# Patient Record
Sex: Male | Born: 1950
Health system: Southern US, Community
[De-identification: ages and names within clinical notes are randomized; demographics above are authoritative.]

## PROBLEM LIST (undated history)

## (undated) DIAGNOSIS — G4733 Obstructive sleep apnea (adult) (pediatric): Secondary | ICD-10-CM

## (undated) DIAGNOSIS — I10 Essential (primary) hypertension: Secondary | ICD-10-CM

## (undated) DIAGNOSIS — Z973 Presence of spectacles and contact lenses: Secondary | ICD-10-CM

## (undated) DIAGNOSIS — N471 Phimosis: Secondary | ICD-10-CM

## (undated) DIAGNOSIS — I471 Supraventricular tachycardia: Secondary | ICD-10-CM

## (undated) DIAGNOSIS — M1990 Unspecified osteoarthritis, unspecified site: Secondary | ICD-10-CM

## (undated) DIAGNOSIS — F4310 Post-traumatic stress disorder, unspecified: Secondary | ICD-10-CM

## (undated) DIAGNOSIS — E785 Hyperlipidemia, unspecified: Secondary | ICD-10-CM

## (undated) DIAGNOSIS — Z9989 Dependence on other enabling machines and devices: Secondary | ICD-10-CM

## (undated) HISTORY — PX: KNEE ARTHROSCOPY: SUR90

## (undated) HISTORY — PX: TOTAL KNEE ARTHROPLASTY: SHX125

## (undated) HISTORY — PX: CARDIAC ELECTROPHYSIOLOGY MAPPING AND ABLATION: SHX1292

---

## 1998-07-16 ENCOUNTER — Ambulatory Visit (HOSPITAL_BASED_OUTPATIENT_CLINIC_OR_DEPARTMENT_OTHER): Admission: RE | Admit: 1998-07-16 | Discharge: 1998-07-16 | Payer: Self-pay | Admitting: Orthopedic Surgery

## 2003-05-05 ENCOUNTER — Encounter: Admission: RE | Admit: 2003-05-05 | Discharge: 2003-05-05 | Payer: Self-pay | Admitting: Cardiology

## 2003-05-18 ENCOUNTER — Ambulatory Visit (HOSPITAL_COMMUNITY): Admission: RE | Admit: 2003-05-18 | Discharge: 2003-05-18 | Payer: Self-pay | Admitting: Cardiology

## 2003-12-23 ENCOUNTER — Emergency Department (HOSPITAL_COMMUNITY): Admission: EM | Admit: 2003-12-23 | Discharge: 2003-12-23 | Payer: Self-pay | Admitting: Emergency Medicine

## 2005-06-08 ENCOUNTER — Ambulatory Visit (HOSPITAL_COMMUNITY): Admission: RE | Admit: 2005-06-08 | Discharge: 2005-06-08 | Payer: Self-pay | Admitting: Gastroenterology

## 2005-06-08 ENCOUNTER — Encounter (INDEPENDENT_AMBULATORY_CARE_PROVIDER_SITE_OTHER): Payer: Self-pay | Admitting: *Deleted

## 2007-06-28 ENCOUNTER — Encounter: Admission: RE | Admit: 2007-06-28 | Discharge: 2007-06-28 | Payer: Self-pay | Admitting: Cardiology

## 2008-01-10 DIAGNOSIS — I471 Supraventricular tachycardia, unspecified: Secondary | ICD-10-CM

## 2008-01-10 HISTORY — DX: Supraventricular tachycardia: I47.1

## 2008-01-10 HISTORY — DX: Supraventricular tachycardia, unspecified: I47.10

## 2008-10-29 ENCOUNTER — Observation Stay (HOSPITAL_COMMUNITY): Admission: AD | Admit: 2008-10-29 | Discharge: 2008-10-31 | Payer: Self-pay | Admitting: Cardiology

## 2008-10-29 ENCOUNTER — Ambulatory Visit: Payer: Self-pay | Admitting: Internal Medicine

## 2008-10-30 HISTORY — PX: CARDIOVASCULAR STRESS TEST: SHX262

## 2008-11-12 ENCOUNTER — Telehealth: Payer: Self-pay | Admitting: Internal Medicine

## 2008-11-12 ENCOUNTER — Ambulatory Visit: Payer: Self-pay

## 2008-11-12 ENCOUNTER — Encounter: Payer: Self-pay | Admitting: Internal Medicine

## 2008-11-12 DIAGNOSIS — I10 Essential (primary) hypertension: Secondary | ICD-10-CM

## 2008-11-12 DIAGNOSIS — I200 Unstable angina: Secondary | ICD-10-CM

## 2008-11-23 ENCOUNTER — Ambulatory Visit: Payer: Self-pay | Admitting: Cardiology

## 2008-11-23 ENCOUNTER — Ambulatory Visit (HOSPITAL_COMMUNITY): Admission: RE | Admit: 2008-11-23 | Discharge: 2008-11-23 | Payer: Self-pay | Admitting: Internal Medicine

## 2008-11-23 ENCOUNTER — Ambulatory Visit: Payer: Self-pay | Admitting: Internal Medicine

## 2008-11-23 ENCOUNTER — Encounter: Payer: Self-pay | Admitting: Internal Medicine

## 2008-11-23 ENCOUNTER — Ambulatory Visit: Payer: Self-pay

## 2008-11-23 HISTORY — PX: TRANSTHORACIC ECHOCARDIOGRAM: SHX275

## 2008-11-24 ENCOUNTER — Ambulatory Visit: Payer: Self-pay | Admitting: Internal Medicine

## 2008-11-24 ENCOUNTER — Telehealth: Payer: Self-pay | Admitting: Internal Medicine

## 2008-11-25 ENCOUNTER — Ambulatory Visit (HOSPITAL_COMMUNITY): Admission: AD | Admit: 2008-11-25 | Discharge: 2008-11-26 | Payer: Self-pay | Admitting: Internal Medicine

## 2008-11-25 ENCOUNTER — Ambulatory Visit: Payer: Self-pay | Admitting: Internal Medicine

## 2008-11-25 LAB — CONVERTED CEMR LAB
BUN: 19 mg/dL (ref 6–23)
Basophils Absolute: 0.1 10*3/uL (ref 0.0–0.1)
Basophils Relative: 0.7 % (ref 0.0–3.0)
CO2: 29 meq/L (ref 19–32)
Calcium: 9.2 mg/dL (ref 8.4–10.5)
Chloride: 106 meq/L (ref 96–112)
Creatinine, Ser: 1.3 mg/dL (ref 0.4–1.5)
Eosinophils Absolute: 0.2 10*3/uL (ref 0.0–0.7)
Eosinophils Relative: 2 % (ref 0.0–5.0)
GFR calc non Af Amer: 72.37 mL/min (ref >60)
Glucose, Bld: 89 mg/dL (ref 70–99)
HCT: 43.4 % (ref 39.0–52.0)
Hemoglobin: 14.7 g/dL (ref 13.0–17.0)
INR: 1.1 — ABNORMAL HIGH (ref 0.8–1.0)
Lymphocytes Relative: 30.9 % (ref 12.0–46.0)
Lymphs Abs: 2.6 10*3/uL (ref 0.7–4.0)
MCHC: 33.8 g/dL (ref 30.0–36.0)
MCV: 93 fL (ref 78.0–100.0)
Monocytes Absolute: 1.1 10*3/uL — ABNORMAL HIGH (ref 0.1–1.0)
Monocytes Relative: 13.5 % — ABNORMAL HIGH (ref 3.0–12.0)
Neutro Abs: 4.3 10*3/uL (ref 1.4–7.7)
Neutrophils Relative %: 52.9 % (ref 43.0–77.0)
Platelets: 151 10*3/uL (ref 150.0–400.0)
Potassium: 3.8 meq/L (ref 3.5–5.1)
Prothrombin Time: 11.6 s (ref 9.1–11.7)
RBC: 4.67 M/uL (ref 4.22–5.81)
RDW: 12.7 % (ref 11.5–14.6)
Sodium: 144 meq/L (ref 135–145)
WBC: 8.3 10*3/uL (ref 4.5–10.5)
aPTT: 28.3 s (ref 21.7–28.8)

## 2008-12-07 ENCOUNTER — Encounter: Payer: Self-pay | Admitting: Internal Medicine

## 2009-02-25 ENCOUNTER — Encounter: Payer: Self-pay | Admitting: Physician Assistant

## 2009-02-25 ENCOUNTER — Ambulatory Visit: Payer: Self-pay | Admitting: Cardiology

## 2009-02-25 DIAGNOSIS — R002 Palpitations: Secondary | ICD-10-CM

## 2009-02-25 LAB — CONVERTED CEMR LAB
BUN: 13 mg/dL (ref 6–23)
CO2: 32 meq/L (ref 19–32)
Calcium: 9.3 mg/dL (ref 8.4–10.5)
Chloride: 104 meq/L (ref 96–112)
Creatinine, Ser: 1.1 mg/dL (ref 0.4–1.5)
GFR calc non Af Amer: 87.68 mL/min (ref >60)
Glucose, Bld: 98 mg/dL (ref 70–99)
Potassium: 4.5 meq/L (ref 3.5–5.1)
Sodium: 142 meq/L (ref 135–145)
TSH: 1.77 microintl units/mL (ref 0.35–5.50)

## 2009-03-01 ENCOUNTER — Ambulatory Visit: Payer: Self-pay

## 2009-03-18 ENCOUNTER — Telehealth (INDEPENDENT_AMBULATORY_CARE_PROVIDER_SITE_OTHER): Payer: Self-pay | Admitting: *Deleted

## 2009-03-24 ENCOUNTER — Ambulatory Visit: Payer: Self-pay | Admitting: Internal Medicine

## 2009-04-06 ENCOUNTER — Telehealth (INDEPENDENT_AMBULATORY_CARE_PROVIDER_SITE_OTHER): Payer: Self-pay | Admitting: *Deleted

## 2009-04-09 ENCOUNTER — Telehealth: Payer: Self-pay | Admitting: Internal Medicine

## 2009-04-15 ENCOUNTER — Telehealth: Payer: Self-pay | Admitting: Internal Medicine

## 2010-02-08 NOTE — Assessment & Plan Note (Signed)
Summary: ROV/ GD   Visit Type:  Follow-up  CC:  pt states feeling irregular heart rate.  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 He is done well since then until now.  The patient complains of several week history of skipping of his heart which lasted 15-20 seconds causes a sluggish feeling resolves. This is off-and-on all day long. Some days he doesn't have it but other days. It is more frequent. He says is different than the pounding. He had when he had his ablation. He denies any dizziness, chest pain, dyspnea, dyspnea on exertion, presyncope, or syncope. He drinks one cup of coffee a day, and one soda a day. Occasionally, he'll have some tea. He is very anxious about this and has many questions that I answered.  Problems Prior to Update: 1)  Palpitations  (ICD-785.1) 2)  Paroxysmal Supraventricular Tachycardia  (ICD-427.0) 3)  Svt/ Psvt/ Pat  (ICD-427.0) 4)  Sinus Bradycardia  (ICD-427.81) 5)  Hypertension, Benign  (ICD-401.1) 6)  Intermediate Coronary Syndrome  (ICD-411.1)  Current Medications (verified): 1)  Simvastatin 40 Mg Tabs (Simvastatin) .... Take One Tablet Daily 2)  Aspirin Ec 81 Mg Tbec (Aspirin) .... Take One Tablet Daily 3)  Diovan Hct 160-25 Mg Tabs (Valsartan-Hydrochlorothiazide) .... Take One Daily  Allergies: No Known Drug Allergies  Past History:  Past Medical History: Last updated: 11/12/2008 tinnitus hypertension bradycardia svt dyslipidemia  Family History: Reviewed history from 11/12/2008 and no changes required. Family History of Coronary Artery Disease:   Social History: Reviewed history from 11/12/2008 and no changes required. Married  Tobacco Use - No.  Alcohol Use - no  Review of Systems       shistory of present illness patient has an exercise bike, but is reluctant to use it  because he is afraid of what would happen if he got his heart rate up  Vital Signs:  Patient profile:   60 year old male Height:      76 inches Weight:      239 pounds Pulse rate:   76 / minute Pulse rhythm:   regular BP sitting:   112 / 80  (right arm)  Vitals Entered By: Jacquelin Hawking, CMA (February 25, 2009 10:00 AM)  Physical Exam  General:   Well-nournished, in no acute distress. Neck: No JVD, HJR, Bruit, or thyroid enlargement Lungs: No tachypnea, clear without wheezing, rales, or rhonchi Cardiovascular: RRR, PMI not displaced, heart sounds normal, no murmurs, gallops, bruit, thrill, or heave. Abdomen: BS normal. Soft without organomegaly, masses, lesions or tenderness. Extremities: without cyanosis, clubbing or edema. Good distal pulses bilateral SKin: Warm, no lesions or rashes  Musculoskeletal: No deformities Neuro: no focal signs    EKG  Procedure date:  02/25/2009  Findings:      normal sinus rhythm, normal EKG  Impression & Recommendations:  Problem # 1:  PALPITATIONS (ICD-785.1) Patient has several week history of new onset palpitations, which are different from when he had his ablation. I asked him to decrease his caffeine intake, we will check a BMET & TSH. F/U Dr. Graciela Husbands The following medications were removed from the medication list:    Verapamil Hcl 120 Mg Tabs (Verapamil hcl) .Marland Kitchen... Take two tablet every am His updated medication list for this problem includes:    Aspirin Ec 81 Mg Tbec (Aspirin) .Marland Kitchen... Take one tablet daily  Problem # 2:  PAROXYSMAL SUPRAVENTRICULAR  TACHYCARDIA (ICD-427.0) Patient had radiofrequency catheter ablation of the AV nodal reentrant tachycardia in November 2010 The following medications were removed from the medication list:    Verapamil Hcl 120 Mg Tabs (Verapamil hcl) .Marland Kitchen... Take two tablet every am His updated medication list for this problem includes:    Aspirin Ec 81 Mg Tbec (Aspirin) .Marland Kitchen... Take one tablet  daily  Orders: EKG w/ Interpretation (93000) TLB-BMP (Basic Metabolic Panel-BMET) (80048-METABOL) TLB-TSH (Thyroid Stimulating Hormone) (84443-TSH) Event (Event)  Problem # 3:  HYPERTENSION, BENIGN (ICD-401.1) blood pressure controlled The following medications were removed from the medication list:    Verapamil Hcl 120 Mg Tabs (Verapamil hcl) .Marland Kitchen... Take two tablet every am    Hydrochlorothiazide 25 Mg Tabs (Hydrochlorothiazide) .Marland Kitchen... Take 1/2 tablet daily His updated medication list for this problem includes:    Aspirin Ec 81 Mg Tbec (Aspirin) .Marland Kitchen... Take one tablet daily    Diovan Hct 160-25 Mg Tabs (Valsartan-hydrochlorothiazide) .Marland Kitchen... Take one daily  Orders: TLB-BMP (Basic Metabolic Panel-BMET) (80048-METABOL) TLB-TSH (Thyroid Stimulating Hormone) (84443-TSH)  Problem # 4:  SINUS BRADYCARDIA (ICD-427.81) patient has history of bradycardia, but is in sinus rhythm today The following medications were removed from the medication list:    Verapamil Hcl 120 Mg Tabs (Verapamil hcl) .Marland Kitchen... Take two tablet every am His updated medication list for this problem includes:    Aspirin Ec 81 Mg Tbec (Aspirin) .Marland Kitchen... Take one tablet daily  Orders: Event (Event)  Problem # 5:  HYPERTENSION, BENIGN (ICD-401.1) blood pressure control The following medications were removed from the medication list:    Verapamil Hcl 120 Mg Tabs (Verapamil hcl) .Marland Kitchen... Take two tablet every am    Hydrochlorothiazide 25 Mg Tabs (Hydrochlorothiazide) .Marland Kitchen... Take 1/2 tablet daily His updated medication list for this problem includes:    Aspirin Ec 81 Mg Tbec (Aspirin) .Marland Kitchen... Take one tablet daily    Diovan Hct 160-25 Mg Tabs (Valsartan-hydrochlorothiazide) .Marland Kitchen... Take one daily  Orders: TLB-BMP (Basic Metabolic Panel-BMET) (80048-METABOL) TLB-TSH (Thyroid Stimulating Hormone) (03500-XFG)  Patient Instructions: 1)  Your physician recommends that you schedule a follow-up appointment in: 2 to 3 weeks with Dr. Graciela Husbands 2)   Your physician has recommended that you wear an event monitor.  Event monitors are medical devices that record the heart's electrical activity. Doctors most often use these monitors to diagnose arrhythmias. Arrhythmias are problems with the speed or rhythm of the heartbeat. The monitor is a small, portable device. You can wear one while you do your normal daily activities. This is usually used to diagnose what is causing palpitations/syncope (passing out). 3)  Your physician recommends that you return for lab work in: today BMET,TSH.

## 2010-02-08 NOTE — Progress Notes (Signed)
  Walk in Patient Form Recieved " Pateint left [facility] forms to be completed" forwarded to [facility]  April 06, 2009 12:12 PM

## 2010-02-08 NOTE — Progress Notes (Signed)
Summary: CALLING REGARDING PAPER WORK  Phone Note Call from Patient Call back at [phone]   Caller: Patient Action Taken: Appt Scheduled Summary of Call: PT CALLING ABOUT PAPER WORK Initial call taken by: Judie Grieve,  April 15, 2009 3:57 PM  Follow-up for Phone Call        Pt has dropped off paperwork from the [place] that Dr. Graciela Husbands needs to fill out concerning the pt's RF ablation in Nov. 2010.  He needs this ASAP so that he can get it to the [place] - they've already sent him a couple of notices about this paperwork. Will forward this to [facility] and Dr. Graciela Husbands. Follow-up by: Minerva Areola, RN, BSN,  April 15, 2009 5:10 PM  Additional Follow-up for Phone Call Additional follow up Details #1::        this was done  about a week ago I dont know where it is Additional Follow-up by: Nathen May, MD, [facility],  April 15, 2009 6:14 PM     Appended Document: CALLING REGARDING PAPER WORK Paperwork at front desk-- copied last office notes and hospital discharge summary for pt to take to [place].  Cell phone number unavailable.   Appended Document: CALLING REGARDING PAPER WORK SPoke with pt this morning concerning paperwork,Told him to leave a Message for [employer] Nurse...Marland KitchenMarland Kitchen

## 2010-02-08 NOTE — Assessment & Plan Note (Signed)
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 He is done well since then until now.  The patient complains of several week history of skipping of his heart which lasted 15-20 seconds causes a sluggish feeling resolves. This is off-and-on all day long. He saw Herma Carson will arrange for him to have a very clear. This demonstrated PVCs that were symptomatic PACs that were symptomatic and a wide complex 8 beat run that was detected automatically.  Otherwise he feels terrific;  he has been using some caffeine he has been trying to cut back on this.  Current Medications (verified): 1)  Simvastatin 40 Mg Tabs (Simvastatin) .... Take One Tablet Daily 2)  Aspirin Ec 81 Mg Tbec (Aspirin) .... Take One Tablet Daily 3)  Diovan Hct 160-25 Mg Tabs (Valsartan-Hydrochlorothiazide) .... Take One Daily  Allergies (verified): No Known Drug Allergies  Past History:  Past Medical History: Last updated: 11/12/2008 tinnitus hypertension bradycardia svt dyslipidemia  Past Surgical History: Last updated: 11/12/2008 neg  Family History: Last updated: 11/12/2008 Family History of Coronary Artery Disease:   Social History: Last updated: 11/12/2008 Married  Tobacco Use - No.  Alcohol Use - no  Vital Signs:  Patient profile:   60 year old male Height:      76 inches Weight:      233 pounds BMI:     28.46 Pulse rate:   92 / minute Pulse rhythm:   regular BP sitting:   125 / 83  (left arm) Cuff size:   large  Vitals Entered By: Judithe Modest CMA (March 24, 2009 11:36 AM)  Physical Exam  General:  The patient was alert and oriented in no acute distress. HEENT Normal.  Neck veins were flat, carotids were brisk.  Lungs were clear.  Heart sounds were regular without murmurs or gallops.  Abdomen was soft with  active bowel sounds. There is no clubbing cyanosis or edema. Skin Warm and dry    Event Monitor  Procedure date:  03/24/2009  Findings:      recorder demonstrated symptomatic PACs and PVCs and a symptomatic wide-complex from about 8 beats probably SVT  Impression & Recommendations:  Problem # 1:  PALPITATIONS (ICD-785.1) history Dr. correlate with PVCs and PACs. Reassurance was given His updated medication list for this problem includes:    Aspirin Ec 81 Mg Tbec (Aspirin) .Marland Kitchen... Take one tablet daily  Problem # 2:  SVT/ PSVT/ PAT (ICD-427.0) no recurrent SVT His updated medication list for this problem includes:    Aspirin Ec 81 Mg Tbec (Aspirin) .Marland Kitchen... Take one tablet daily  Problem # 3:  HYPERTENSION, BENIGN (ICD-401.1) blood pressure is well controlled His updated medication list for this problem includes:    Aspirin Ec 81 Mg Tbec (Aspirin) .Marland Kitchen... Take one tablet daily    Diovan Hct 160-25 Mg Tabs (Valsartan-hydrochlorothiazide) .Marland Kitchen... Take one daily  Other Orders: EKG w/ Interpretation (93000)  Patient Instructions: 1)  Your physician recommends that you schedule a follow-up appointment in: 6 MONTHS

## 2010-02-08 NOTE — Progress Notes (Signed)
Summary: pt needs papers dropped off from [place]  Phone Note Call from Patient Call back at [phone]   Caller: Patient Reason for Call: Talk to Nurse, Talk to Doctor Summary of Call: pt need paper work that was dropped off from [place] office Initial call taken by: Omer Jack,  April 09, 2009 4:25 PM  Follow-up for Phone Call        spoke with amber, she has the form and dr Graciela Husbands will sign on monday or tuesday and we will let pt know when completed. pt aware Robert Goody, RN  April 09, 2009 4:37 PM

## 2010-02-08 NOTE — Progress Notes (Signed)
Summary: Monitor Results  Phone Note Outgoing Call   Call placed by: Duncan Dull, RN, BSN,  March 18, 2009 4:07 PM Call placed to: Patient Summary of Call: Received monitor report that showed NSVT. I called the pt and he was at work at the time of the event. I ask him how he was feeling today and he told he that he had a feeling today around 2pm (the event I received was at 1200) that he cannot describe. He said it was just the worst feeling he has ever felt. It only last for a few seconds. Afterwards he felt weak and dizzy for a little while. He did not think to hit the button to send a trasmission (said that was the last thing on his mind). He was "working hard" during the time of the episode a bit harder than usual. I moved his appt from 3/28 with Dr. Graciela Husbands up to 3/16 (the next time he is in the office). I s/w Dr. Johney Frame who confirmed NSVT and thinks its ok for him to wait until Wed. I advised the pt if he gets "that feeling" anymore between now and then that lasts more than a few seconds to call 911. He understands.  Initial call taken by: Duncan Dull, RN, BSN,  March 18, 2009 4:24 PM

## 2010-04-10 ENCOUNTER — Emergency Department (HOSPITAL_BASED_OUTPATIENT_CLINIC_OR_DEPARTMENT_OTHER)
Admission: EM | Admit: 2010-04-10 | Discharge: 2010-04-10 | Disposition: A | Discharge status: Home / Self Care | Payer: BC Managed Care – PPO | Attending: Emergency Medicine | Admitting: Emergency Medicine

## 2010-04-10 ENCOUNTER — Emergency Department (INDEPENDENT_AMBULATORY_CARE_PROVIDER_SITE_OTHER): Payer: BC Managed Care – PPO

## 2010-04-10 DIAGNOSIS — Y93E1 Activity, personal bathing and showering: Secondary | ICD-10-CM | POA: Insufficient documentation

## 2010-04-10 DIAGNOSIS — M533 Sacrococcygeal disorders, not elsewhere classified: Secondary | ICD-10-CM | POA: Insufficient documentation

## 2010-04-10 DIAGNOSIS — I10 Essential (primary) hypertension: Secondary | ICD-10-CM | POA: Insufficient documentation

## 2010-04-10 DIAGNOSIS — S63509A Unspecified sprain of unspecified wrist, initial encounter: Secondary | ICD-10-CM | POA: Insufficient documentation

## 2010-04-10 DIAGNOSIS — Z79899 Other long term (current) drug therapy: Secondary | ICD-10-CM | POA: Insufficient documentation

## 2010-04-10 DIAGNOSIS — E785 Hyperlipidemia, unspecified: Secondary | ICD-10-CM | POA: Insufficient documentation

## 2010-04-10 DIAGNOSIS — W010XXA Fall on same level from slipping, tripping and stumbling without subsequent striking against object, initial encounter: Secondary | ICD-10-CM | POA: Insufficient documentation

## 2010-04-10 DIAGNOSIS — Y92009 Unspecified place in unspecified non-institutional (private) residence as the place of occurrence of the external cause: Secondary | ICD-10-CM | POA: Insufficient documentation

## 2010-04-11 DIAGNOSIS — Y93E1 Activity, personal bathing and showering: Secondary | ICD-10-CM

## 2010-04-11 DIAGNOSIS — W010XXA Fall on same level from slipping, tripping and stumbling without subsequent striking against object, initial encounter: Secondary | ICD-10-CM

## 2010-04-11 DIAGNOSIS — M25559 Pain in unspecified hip: Secondary | ICD-10-CM

## 2010-04-11 DIAGNOSIS — M25539 Pain in unspecified wrist: Secondary | ICD-10-CM

## 2010-04-14 LAB — BASIC METABOLIC PANEL
BUN: 16 mg/dL (ref 6–23)
CO2: 29 mEq/L (ref 19–32)
Calcium: 8.7 mg/dL (ref 8.4–10.5)
Chloride: 102 mEq/L (ref 96–112)
Creatinine, Ser: 1.37 mg/dL (ref 0.4–1.5)
GFR calc Af Amer: >60 mL/min (ref >60)
GFR calc non Af Amer: 53 mL/min — ABNORMAL LOW (ref >60)
Glucose, Bld: 120 mg/dL — ABNORMAL HIGH (ref 70–99)
Potassium: 4.5 mEq/L (ref 3.5–5.1)
Sodium: 136 mEq/L (ref 135–145)

## 2010-04-14 LAB — TROPONIN I
Troponin I: 0.02 ng/mL (ref 0.00–0.06)
Troponin I: 0.03 ng/mL (ref 0.00–0.06)
Troponin I: 0.05 ng/mL (ref 0.00–0.06)

## 2010-04-14 LAB — CK TOTAL AND CKMB (NOT AT ARMC)
CK, MB: 4.5 ng/mL — ABNORMAL HIGH (ref 0.3–4.0)
CK, MB: 4.6 ng/mL — ABNORMAL HIGH (ref 0.3–4.0)
CK, MB: 8.1 ng/mL — ABNORMAL HIGH (ref 0.3–4.0)
Relative Index: 1.4 (ref 0.0–2.5)
Relative Index: 1.5 (ref 0.0–2.5)
Relative Index: 1.7 (ref 0.0–2.5)
Total CK: 298 U/L — ABNORMAL HIGH (ref 7–232)
Total CK: 327 U/L — ABNORMAL HIGH (ref 7–232)
Total CK: 468 U/L — ABNORMAL HIGH (ref 7–232)

## 2010-04-14 LAB — LIPID PANEL
Cholesterol: 164 mg/dL (ref 0–200)
HDL: 32 mg/dL — ABNORMAL LOW (ref >39)
LDL Cholesterol: 77 mg/dL (ref 0–99)
Total CHOL/HDL Ratio: 5.1 RATIO
Triglycerides: 277 mg/dL — ABNORMAL HIGH (ref <150)
VLDL: 55 mg/dL — ABNORMAL HIGH (ref 0–40)

## 2010-04-14 LAB — CBC
HCT: 40.8 % (ref 39.0–52.0)
Hemoglobin: 14.1 g/dL (ref 13.0–17.0)
MCHC: 34.5 g/dL (ref 30.0–36.0)
MCV: 91.2 fL (ref 78.0–100.0)
Platelets: 129 10*3/uL — ABNORMAL LOW (ref 150–400)
RBC: 4.48 MIL/uL (ref 4.22–5.81)
RDW: 13.9 % (ref 11.5–15.5)
WBC: 6.7 10*3/uL (ref 4.0–10.5)

## 2010-04-14 LAB — BRAIN NATRIURETIC PEPTIDE: Pro B Natriuretic peptide (BNP): 307 pg/mL — ABNORMAL HIGH (ref 0.0–100.0)

## 2010-04-14 LAB — TSH: TSH: 2.623 u[IU]/mL (ref 0.350–4.500)

## 2010-04-14 LAB — PROTIME-INR
INR: 1.05 (ref 0.00–1.49)
Prothrombin Time: 13.6 seconds (ref 11.6–15.2)

## 2010-05-27 NOTE — Op Note (Signed)
NAMEENDRE, Robert NO.:  [id number]   MEDICAL RECORD NO.:  [id number]          PATIENT TYPE:  AMB   LOCATION:  ENDO                         FACILITY:  [facility]   PHYSICIAN:  Bernette Redbird, M.D.   DATE OF BIRTH:  [birth year]   DATE OF PROCEDURE:  06/08/2005  DATE OF DISCHARGE:                                 OPERATIVE REPORT   PROCEDURE:  Colonoscopy with biopsy.   INDICATIONS:  Initial colon cancer screening exam in a 60 year old gentleman  with a possible family history of colon cancer (two uncles), details  sketchy.  No worrisome symptoms.   FINDINGS:  Diminutive sessile polyp in the ascending colon.   PROCEDURE:  The procedure had been discussed with the patient through our  open access program, but I reviewed the purpose and risks of the procedure  with the patient immediately prior to the exam.  Written consent was  provided.  Digital exam of the prostate was unremarkable.  Sedation was  fentanyl 100 mcg and Versed 10 mg IV without arrhythmias or desaturation.   The Olympus adult video colonoscope was advanced to the terminal ileum  without difficulty.  The terminal ileum had some lymphoid hyperplasia but  was otherwise normal.  The quality of the prep was excellent and it is felt  that all areas were well-seen.   There was a 2-mm sessile polyp of the proximal ascending colon removed by  single cold biopsy.  No other polyps were seen, and there was no evidence of  cancer, colitis, vascular malformations or diverticular disease.  Retroflexion in the rectum was unremarkable.   The patient tolerated the procedure well, and there were no apparent  complications.   IMPRESSION:  1.  Solitary diminutive sessile polyp removed as described above (211.3).  2.  Screening for colon cancer in a patient with possible family history of      colon cancer (V76.51).   PLAN:  Await pathology.           ______________________________  Bernette Redbird, M.D.     RB/MEDQ  D:  06/08/2005  T:  06/08/2005  Job:  [id number]   cc:   Osvaldo Shipper. Spruill, M.D.  Fax: [phone]

## 2012-05-14 ENCOUNTER — Encounter (HOSPITAL_BASED_OUTPATIENT_CLINIC_OR_DEPARTMENT_OTHER): Payer: Self-pay

## 2012-05-14 ENCOUNTER — Emergency Department (HOSPITAL_BASED_OUTPATIENT_CLINIC_OR_DEPARTMENT_OTHER)
Admission: EM | Admit: 2012-05-14 | Discharge: 2012-05-14 | Disposition: A | Discharge status: Home / Self Care | Payer: 59 | Attending: Emergency Medicine | Admitting: Emergency Medicine

## 2012-05-14 DIAGNOSIS — S058X9A Other injuries of unspecified eye and orbit, initial encounter: Secondary | ICD-10-CM | POA: Insufficient documentation

## 2012-05-14 DIAGNOSIS — S0501XA Injury of conjunctiva and corneal abrasion without foreign body, right eye, initial encounter: Secondary | ICD-10-CM

## 2012-05-14 DIAGNOSIS — Y929 Unspecified place or not applicable: Secondary | ICD-10-CM | POA: Insufficient documentation

## 2012-05-14 DIAGNOSIS — H10219 Acute toxic conjunctivitis, unspecified eye: Secondary | ICD-10-CM | POA: Insufficient documentation

## 2012-05-14 DIAGNOSIS — I10 Essential (primary) hypertension: Secondary | ICD-10-CM | POA: Insufficient documentation

## 2012-05-14 DIAGNOSIS — Z7982 Long term (current) use of aspirin: Secondary | ICD-10-CM | POA: Insufficient documentation

## 2012-05-14 DIAGNOSIS — Y939 Activity, unspecified: Secondary | ICD-10-CM | POA: Insufficient documentation

## 2012-05-14 DIAGNOSIS — E785 Hyperlipidemia, unspecified: Secondary | ICD-10-CM | POA: Insufficient documentation

## 2012-05-14 DIAGNOSIS — Z79899 Other long term (current) drug therapy: Secondary | ICD-10-CM | POA: Insufficient documentation

## 2012-05-14 DIAGNOSIS — H10211 Acute toxic conjunctivitis, right eye: Secondary | ICD-10-CM

## 2012-05-14 DIAGNOSIS — T6091XA Toxic effect of unspecified pesticide, accidental (unintentional), initial encounter: Secondary | ICD-10-CM | POA: Insufficient documentation

## 2012-05-14 HISTORY — DX: Essential (primary) hypertension: I10

## 2012-05-14 HISTORY — DX: Hyperlipidemia, unspecified: E78.5

## 2012-05-14 MED ORDER — FLUORESCEIN SODIUM 1 MG OP STRP
ORAL_STRIP | OPHTHALMIC | Status: AC
  Administered 2012-05-14: 1 via OPHTHALMIC
  Filled 2012-05-14: qty 1

## 2012-05-14 MED ORDER — TETRACAINE HCL 0.5 % OP SOLN
1.0000 [drp] | Freq: Once | OPHTHALMIC | Status: AC
  Administered 2012-05-14: 2 [drp] via OPHTHALMIC

## 2012-05-14 MED ORDER — TETRACAINE HCL 0.5 % OP SOLN
OPHTHALMIC | Status: AC
  Administered 2012-05-14: 2 [drp] via OPHTHALMIC
  Filled 2012-05-14: qty 2

## 2012-05-14 MED ORDER — FLUORESCEIN SODIUM 1 MG OP STRP
1.0000 | ORAL_STRIP | Freq: Once | OPHTHALMIC | Status: AC
  Administered 2012-05-14: 1 via OPHTHALMIC

## 2012-05-14 NOTE — ED Notes (Signed)
Pt states that he got an insecticide in his eye, pt states that he washed his eye out with water briefly, then used soap to wash his eye out.  Pt with redness and watering to OD.

## 2012-05-14 NOTE — ED Notes (Signed)
Contacted [facility] center who recommended that pt's eye be irrigated with normal saline, states that pt should not have any permanent damage from this exposure, at worst a corneal abrasion may present, if any vision loss today is noted, it should return by the morning, if not follow up with opthalmology.

## 2012-05-14 NOTE — ED Provider Notes (Signed)
History     CSN: [id number]  Arrival date & time 05/14/12  1653   First MD Initiated Contact with Patient 05/14/12 1747      Chief Complaint  Patient presents with  . Eye Problem    (Consider location/radiation/quality/duration/timing/severity/associated sxs/prior treatment) HPI 62 year old male accidentally splashed a drop of insecticide in his right eye and was instructed to come to the emergency department according to directions on the bottle of insecticide. He has some foreign body sensation in mild to moderate pain but no change in vision no pus drainage no other concerns in this accident occurred just prior to arrival in his tetanus shot is up-to-date. Past Medical History  Diagnosis Date  . Hypertension   . Hyperlipidemia     Past Surgical History  Procedure Laterality Date  . Total knee arthroplasty Bilateral   . Cardiac electrophysiology mapping and ablation      History reviewed. No pertinent family history.  History  Substance Use Topics  . Smoking status: Never Smoker   . Smokeless tobacco: Never Used  . Alcohol Use: No      Review of Systems See HPI. Allergies  Review of patient's allergies indicates no known allergies.  Home Medications   Current Outpatient Rx  Name  Route  Sig  Dispense  Refill  . aspirin 81 MG tablet   Oral   Take 81 mg by mouth daily.         Marland Kitchen SIMVASTATIN PO   Oral   Take by mouth.         Marland Kitchen UNKNOWN TO PATIENT      Blood pressure medication taken daily           BP 130/86  Pulse 98  Temp(Src) 98.9 F (37.2 C) (Oral)  Resp 20  Ht 6\' 3"  (1.905 m)  Wt 235 lb (106.595 kg)  BMI 29.37 kg/m2  SpO2 95%  Physical Exam  Nursing note and vitals reviewed. Constitutional:  Awake, alert, nontoxic appearance.  HENT:  Head: Atraumatic.  Eyes: EOM are normal. Pupils are equal, round, and reactive to light. Right eye exhibits no discharge. Left eye exhibits no discharge.  Mild right conjunctival injection, visual  acuity baseline for patient, peripheral visual fields full to confrontation, completely relieve the foreign body sensation with tetracaine, swelling examination reveals no corneal foreign body noted anterior chamber is quiet, positive fluorescein uptake consistent with diffuse right corneal abrasion, pH between 7-8 after irrigation  Neck: Neck supple.  Cardiovascular: Normal rate and regular rhythm.   No murmur heard. Pulmonary/Chest: Effort normal and breath sounds normal. No respiratory distress. He has no wheezes. He has no rales. He exhibits no tenderness.  Abdominal: Soft. There is no tenderness. There is no rebound.  Musculoskeletal: He exhibits no tenderness.  Baseline ROM, no obvious new focal weakness.  Neurological:  Mental status and motor strength appears baseline for patient and situation.  Skin: No rash noted.  Psychiatric: He has a normal mood and affect.    ED Course  Procedures (including critical care time)  Labs Reviewed - No data to display No results found.   1. Right corneal abrasion, initial encounter   2. Chemical conjunctivitis, right       MDM   Patient / Family / Caregiver informed of clinical course, understand medical decision-making process, and agree with plan.  I doubt any other EMC precluding discharge at this time including, but not necessarily limited to the following:globe perforation.       Jonny Ruiz  Alberteen Sam, MD 05/15/12 2156

## 2012-05-14 NOTE — ED Notes (Addendum)
MD at bedside. Verbal discharge instructions given.  Pt voiced understanding.

## 2015-12-22 DIAGNOSIS — E785 Hyperlipidemia, unspecified: Secondary | ICD-10-CM | POA: Diagnosis not present

## 2015-12-22 DIAGNOSIS — Z8249 Family history of ischemic heart disease and other diseases of the circulatory system: Secondary | ICD-10-CM | POA: Diagnosis not present

## 2015-12-22 DIAGNOSIS — F4310 Post-traumatic stress disorder, unspecified: Secondary | ICD-10-CM | POA: Diagnosis not present

## 2015-12-22 DIAGNOSIS — I10 Essential (primary) hypertension: Secondary | ICD-10-CM | POA: Diagnosis not present

## 2016-05-05 DIAGNOSIS — I10 Essential (primary) hypertension: Secondary | ICD-10-CM | POA: Diagnosis not present

## 2016-05-05 DIAGNOSIS — I471 Supraventricular tachycardia: Secondary | ICD-10-CM | POA: Diagnosis not present

## 2016-05-05 DIAGNOSIS — E785 Hyperlipidemia, unspecified: Secondary | ICD-10-CM | POA: Diagnosis not present

## 2016-05-05 DIAGNOSIS — Z8249 Family history of ischemic heart disease and other diseases of the circulatory system: Secondary | ICD-10-CM | POA: Diagnosis not present

## 2016-05-08 DIAGNOSIS — R52 Pain, unspecified: Secondary | ICD-10-CM | POA: Diagnosis not present

## 2016-05-08 DIAGNOSIS — S2341XA Sprain of ribs, initial encounter: Secondary | ICD-10-CM | POA: Diagnosis not present

## 2016-05-17 DIAGNOSIS — E785 Hyperlipidemia, unspecified: Secondary | ICD-10-CM | POA: Diagnosis not present

## 2016-05-17 DIAGNOSIS — I10 Essential (primary) hypertension: Secondary | ICD-10-CM | POA: Diagnosis not present

## 2016-10-08 ENCOUNTER — Emergency Department (HOSPITAL_BASED_OUTPATIENT_CLINIC_OR_DEPARTMENT_OTHER)
Admission: EM | Admit: 2016-10-08 | Discharge: 2016-10-08 | Disposition: A | Discharge status: Home / Self Care | Payer: PPO | Attending: Emergency Medicine | Admitting: Emergency Medicine

## 2016-10-08 ENCOUNTER — Emergency Department (HOSPITAL_BASED_OUTPATIENT_CLINIC_OR_DEPARTMENT_OTHER): Payer: PPO

## 2016-10-08 ENCOUNTER — Encounter (HOSPITAL_BASED_OUTPATIENT_CLINIC_OR_DEPARTMENT_OTHER): Payer: Self-pay | Admitting: Emergency Medicine

## 2016-10-08 DIAGNOSIS — W109XXA Fall (on) (from) unspecified stairs and steps, initial encounter: Secondary | ICD-10-CM | POA: Insufficient documentation

## 2016-10-08 DIAGNOSIS — Y9389 Activity, other specified: Secondary | ICD-10-CM | POA: Diagnosis not present

## 2016-10-08 DIAGNOSIS — Z79899 Other long term (current) drug therapy: Secondary | ICD-10-CM | POA: Diagnosis not present

## 2016-10-08 DIAGNOSIS — I10 Essential (primary) hypertension: Secondary | ICD-10-CM | POA: Diagnosis not present

## 2016-10-08 DIAGNOSIS — Z96653 Presence of artificial knee joint, bilateral: Secondary | ICD-10-CM | POA: Diagnosis not present

## 2016-10-08 DIAGNOSIS — Z7982 Long term (current) use of aspirin: Secondary | ICD-10-CM | POA: Insufficient documentation

## 2016-10-08 DIAGNOSIS — S79911A Unspecified injury of right hip, initial encounter: Secondary | ICD-10-CM | POA: Diagnosis not present

## 2016-10-08 DIAGNOSIS — M79604 Pain in right leg: Secondary | ICD-10-CM | POA: Diagnosis not present

## 2016-10-08 DIAGNOSIS — M5430 Sciatica, unspecified side: Secondary | ICD-10-CM | POA: Insufficient documentation

## 2016-10-08 DIAGNOSIS — M25561 Pain in right knee: Secondary | ICD-10-CM | POA: Diagnosis not present

## 2016-10-08 DIAGNOSIS — Y999 Unspecified external cause status: Secondary | ICD-10-CM | POA: Insufficient documentation

## 2016-10-08 DIAGNOSIS — Y929 Unspecified place or not applicable: Secondary | ICD-10-CM | POA: Diagnosis not present

## 2016-10-08 DIAGNOSIS — M25551 Pain in right hip: Secondary | ICD-10-CM | POA: Diagnosis not present

## 2016-10-08 DIAGNOSIS — W19XXXA Unspecified fall, initial encounter: Secondary | ICD-10-CM

## 2016-10-08 DIAGNOSIS — M5431 Sciatica, right side: Secondary | ICD-10-CM | POA: Diagnosis not present

## 2016-10-08 HISTORY — DX: Post-traumatic stress disorder, unspecified: F43.10

## 2016-10-08 MED ORDER — CYCLOBENZAPRINE HCL 10 MG PO TABS: 10.0000 mg | ORAL_TABLET | Freq: Two times a day (BID) | ORAL | 0 refills | Status: DC | PRN

## 2016-10-08 MED ORDER — TRAMADOL HCL 50 MG PO TABS: 50.0000 mg | ORAL_TABLET | Freq: Four times a day (QID) | ORAL | 0 refills | Status: DC | PRN

## 2016-10-08 MED ORDER — METHYLPREDNISOLONE 4 MG PO TBPK: ORAL_TABLET | ORAL | 0 refills | Status: DC

## 2016-10-08 MED ORDER — MELOXICAM 15 MG PO TABS: 15.0000 mg | ORAL_TABLET | Freq: Every day | ORAL | 0 refills | Status: DC

## 2016-10-08 NOTE — Discharge Instructions (Signed)
SEEK IMMEDIATE MEDICAL ATTENTION IF: New numbness, tingling, weakness, or problem with the use of your arms or legs.  Severe back pain not relieved with medications.  Change in bowel or bladder control.  Increasing pain in any areas of the body (such as chest or abdominal pain).  Shortness of breath, dizziness or fainting.  Nausea (feeling sick to your stomach), vomiting, fever, or sweats.  

## 2016-10-08 NOTE — ED Provider Notes (Signed)
[facility] Provider Note   CSN: [id number] Arrival date & time: 10/08/16  1002     History   Chief Complaint Chief Complaint  Patient presents with  . Knee Pain    HPI Robert Arias is a 66 y.o. male who presents emergency Department with chief complaint of right hip pain. Patient states that he lost his balance on the stairs 2 days ago and fell onto his right hip and bottom and slid down several stairs. Since that time he has had pain radiating down the back of the leg and to the lateral side of his calf. He has a history of bilateral total knee replacements and a history of sciatica. He is followed previously with [place] [facility]. Since his pain is worse when he sits on the right, turns to the right, bears weight on the right leg. Denies weakness, loss of bowel/bladder function or saddle anesthesia. Denies neck stiffness, headache, rash.  Denies fever or recent procedures to back.   HPI  Past Medical History:  Diagnosis Date  . Hyperlipidemia   . Hypertension   . PTSD (post-traumatic stress disorder)     Patient Active Problem List   Diagnosis Date Noted  . PALPITATIONS 02/25/2009  . HYPERTENSION, BENIGN 11/12/2008  . INTERMEDIATE CORONARY SYNDROME 11/12/2008    Past Surgical History:  Procedure Laterality Date  . CARDIAC ELECTROPHYSIOLOGY MAPPING AND ABLATION    . TOTAL KNEE ARTHROPLASTY Bilateral        Home Medications    Prior to Admission medications   Medication Sig Start Date End Date Taking? Authorizing Provider  hydrochlorothiazide (HYDRODIURIL) 25 MG tablet Take 25 mg by mouth daily.   Yes [provider]  losartan (COZAAR) 50 MG tablet Take 50 mg by mouth daily.   Yes [provider]  PARoxetine (PAXIL) 10 MG tablet Take 10 mg by mouth daily.   Yes [provider]  QUEtiapine (SEROQUEL) 100 MG tablet Take 100 mg by mouth at bedtime.   Yes [provider]  aspirin 81 MG tablet Take 81 mg by mouth  daily.    [provider]  SIMVASTATIN PO Take by mouth.    [provider]  UNKNOWN TO PATIENT Blood pressure medication taken daily    [provider]    Family History History reviewed. No pertinent family history.  Social History Social History  Substance Use Topics  . Smoking status: Never Smoker  . Smokeless tobacco: Never Used  . Alcohol use No     Allergies   Patient has no known allergies.   Review of Systems Review of Systems  Ten systems reviewed and are negative for acute change, except as noted in the HPI.   Physical Exam Updated Vital Signs BP (!) 153/90 (BP Location: Left Arm)   Pulse 60   Temp 98.2 F (36.8 C) (Oral)   Resp 20   Ht  (1.93 m)   Wt 111.1 kg (245 lb)   SpO2 100%   BMI 29.82 kg/m   Physical Exam  Constitutional: He is oriented to person, place, and time. He appears well-developed and well-nourished. No distress.  HENT:  Head: Normocephalic and atraumatic.  Eyes: Conjunctivae are normal. No scleral icterus.  Neck: Normal range of motion. Neck supple.  Cardiovascular: Normal rate, regular rhythm and normal heart sounds.   Pulmonary/Chest: Effort normal and breath sounds normal. No respiratory distress.  Abdominal: Soft. There is no tenderness.  Musculoskeletal: He exhibits no edema.  Normal knee and  hip exam. No bruising or tenderness noted over the sacrum hip or gluteal region. Positive straight leg test on the right. Normal DTRs and sensation, bilateral DP and PT pulses present. Antalgic gait  Neurological: He is alert and oriented to person, place, and time.  Skin: Skin is warm and dry. He is not diaphoretic.  Psychiatric: His behavior is normal.  Nursing note and vitals reviewed.    ED Treatments / Results  Labs (all labs ordered are listed, but only abnormal results are displayed) Labs Reviewed - No data to display  EKG  EKG Interpretation None       Radiology Dg Knee Complete 4 Views  Right  Result Date: 10/08/2016 CLINICAL DATA:  Acute right knee pain following fall 1 week ago. History of right knee arthroplasty 5-6 years ago. EXAM: RIGHT KNEE - COMPLETE 4+ VIEW COMPARISON:  None. FINDINGS: Right total knee arthroplasty identified. There is no evidence of acute fracture or dislocation. No complicating hardware features noted. IMPRESSION: No acute abnormality. Right total knee arthroplasty without complicating features. Electronically Signed   By: Harmon Pier M.D.   On: 10/08/2016 11:37   Dg Hip Unilat With Pelvis 2-3 Views Right  Result Date: 10/08/2016 CLINICAL DATA:  Acute right hip pain following fall 1 week ago. Initial encounter. EXAM: DG HIP (WITH OR WITHOUT PELVIS) 2-3V RIGHT COMPARISON:  04/11/2010 radiographs FINDINGS: There is no evidence of acute fracture or dislocation. Mild degenerative changes in both hips again identified. No suspicious focal bony lesions are present. Moderate degenerative changes in the lower lumbar spine are again noted. IMPRESSION: No acute abnormality. Mild degenerative changes in the hips. Moderate degenerative changes in the lower lumbar spine. Electronically Signed   By: Harmon Pier M.D.   On: 10/08/2016 11:39    Procedures Procedures (including critical care time)  Medications Ordered in ED Medications - No data to display   Initial Impression / Assessment and Plan / ED Course  I have reviewed the triage vital signs and the nursing notes.  Pertinent labs & imaging results that were available during my care of the patient were reviewed by me and considered in my medical decision making (see chart for details).     Patient with back pain.  No neurological deficits and normal neuro exam.  Patient can walk but states is painful.  No loss of bowel or bladder control.  No concern for cauda equina.  No fever, night sweats, weight loss, h/o cancer, IVDU.  RICE protocol and pain medicine indicated and discussed with patient.    Final  Clinical Impressions(s) / ED Diagnoses   Final diagnoses:  Fall    New Prescriptions New Prescriptions   No medications on file     Arthor Captain, PA-C 10/08/16 1218    Arby Barrette, MD 10/13/16 [phone]

## 2016-10-08 NOTE — ED Triage Notes (Signed)
Patient states that he fell about 1 week ago and hurt his right knee. Hx of knee replacement in the past. Reports that he is now having some hip pain

## 2017-01-20 DIAGNOSIS — I471 Supraventricular tachycardia: Secondary | ICD-10-CM | POA: Diagnosis not present

## 2017-01-20 DIAGNOSIS — E785 Hyperlipidemia, unspecified: Secondary | ICD-10-CM | POA: Diagnosis not present

## 2017-01-20 DIAGNOSIS — I10 Essential (primary) hypertension: Secondary | ICD-10-CM | POA: Diagnosis not present

## 2017-01-22 DIAGNOSIS — E785 Hyperlipidemia, unspecified: Secondary | ICD-10-CM | POA: Diagnosis not present

## 2017-01-22 DIAGNOSIS — I471 Supraventricular tachycardia: Secondary | ICD-10-CM | POA: Diagnosis not present

## 2017-01-22 DIAGNOSIS — F4310 Post-traumatic stress disorder, unspecified: Secondary | ICD-10-CM | POA: Diagnosis not present

## 2017-01-22 DIAGNOSIS — Z8249 Family history of ischemic heart disease and other diseases of the circulatory system: Secondary | ICD-10-CM | POA: Diagnosis not present

## 2017-01-22 DIAGNOSIS — I10 Essential (primary) hypertension: Secondary | ICD-10-CM | POA: Diagnosis not present

## 2017-01-22 DIAGNOSIS — R0789 Other chest pain: Secondary | ICD-10-CM | POA: Diagnosis not present

## 2017-05-21 DIAGNOSIS — N401 Enlarged prostate with lower urinary tract symptoms: Secondary | ICD-10-CM | POA: Diagnosis not present

## 2017-05-21 DIAGNOSIS — N5201 Erectile dysfunction due to arterial insufficiency: Secondary | ICD-10-CM | POA: Diagnosis not present

## 2017-05-21 DIAGNOSIS — R3911 Hesitancy of micturition: Secondary | ICD-10-CM | POA: Diagnosis not present

## 2017-07-10 DIAGNOSIS — N471 Phimosis: Secondary | ICD-10-CM | POA: Diagnosis not present

## 2017-07-10 DIAGNOSIS — N5201 Erectile dysfunction due to arterial insufficiency: Secondary | ICD-10-CM | POA: Diagnosis not present

## 2017-07-10 DIAGNOSIS — R3911 Hesitancy of micturition: Secondary | ICD-10-CM | POA: Diagnosis not present

## 2017-07-10 DIAGNOSIS — N401 Enlarged prostate with lower urinary tract symptoms: Secondary | ICD-10-CM | POA: Diagnosis not present

## 2017-07-20 ENCOUNTER — Other Ambulatory Visit: Payer: Self-pay | Admitting: Urology

## 2017-08-07 ENCOUNTER — Encounter (HOSPITAL_BASED_OUTPATIENT_CLINIC_OR_DEPARTMENT_OTHER): Payer: Self-pay | Admitting: *Deleted

## 2017-08-07 NOTE — Progress Notes (Signed)
Spoke w/ pt via phone for pre-op interview.  Npo after mn w/ exception clear liquids until 0700 (no cream /milk products).  Arrive at 1100.  Need istat 8 and ekg.  Asked pt to bring cpap.

## 2017-08-10 ENCOUNTER — Ambulatory Visit (HOSPITAL_BASED_OUTPATIENT_CLINIC_OR_DEPARTMENT_OTHER)
Admission: RE | Admit: 2017-08-10 | Discharge: 2017-08-10 | Disposition: A | Discharge status: Home / Self Care | Payer: PPO | Source: Ambulatory Visit | Attending: Urology | Admitting: Urology

## 2017-08-10 ENCOUNTER — Encounter (HOSPITAL_BASED_OUTPATIENT_CLINIC_OR_DEPARTMENT_OTHER)
Admission: RE | Disposition: A | Discharge status: Home / Self Care | Payer: Self-pay | Source: Ambulatory Visit | Attending: Urology

## 2017-08-10 ENCOUNTER — Ambulatory Visit (HOSPITAL_BASED_OUTPATIENT_CLINIC_OR_DEPARTMENT_OTHER): Payer: PPO | Admitting: Anesthesiology

## 2017-08-10 ENCOUNTER — Encounter (HOSPITAL_BASED_OUTPATIENT_CLINIC_OR_DEPARTMENT_OTHER): Payer: Self-pay

## 2017-08-10 ENCOUNTER — Other Ambulatory Visit: Payer: Self-pay

## 2017-08-10 DIAGNOSIS — I471 Supraventricular tachycardia: Secondary | ICD-10-CM | POA: Insufficient documentation

## 2017-08-10 DIAGNOSIS — F4310 Post-traumatic stress disorder, unspecified: Secondary | ICD-10-CM | POA: Diagnosis not present

## 2017-08-10 DIAGNOSIS — E785 Hyperlipidemia, unspecified: Secondary | ICD-10-CM | POA: Insufficient documentation

## 2017-08-10 DIAGNOSIS — Z9889 Other specified postprocedural states: Secondary | ICD-10-CM | POA: Insufficient documentation

## 2017-08-10 DIAGNOSIS — Z79899 Other long term (current) drug therapy: Secondary | ICD-10-CM | POA: Diagnosis not present

## 2017-08-10 DIAGNOSIS — Z96653 Presence of artificial knee joint, bilateral: Secondary | ICD-10-CM | POA: Diagnosis not present

## 2017-08-10 DIAGNOSIS — G4733 Obstructive sleep apnea (adult) (pediatric): Secondary | ICD-10-CM | POA: Diagnosis not present

## 2017-08-10 DIAGNOSIS — F419 Anxiety disorder, unspecified: Secondary | ICD-10-CM | POA: Diagnosis not present

## 2017-08-10 DIAGNOSIS — Z87891 Personal history of nicotine dependence: Secondary | ICD-10-CM | POA: Insufficient documentation

## 2017-08-10 DIAGNOSIS — I10 Essential (primary) hypertension: Secondary | ICD-10-CM | POA: Insufficient documentation

## 2017-08-10 DIAGNOSIS — N471 Phimosis: Secondary | ICD-10-CM | POA: Diagnosis not present

## 2017-08-10 DIAGNOSIS — Z7982 Long term (current) use of aspirin: Secondary | ICD-10-CM | POA: Insufficient documentation

## 2017-08-10 HISTORY — DX: Phimosis: N47.1

## 2017-08-10 HISTORY — DX: Presence of spectacles and contact lenses: Z97.3

## 2017-08-10 HISTORY — DX: Obstructive sleep apnea (adult) (pediatric): G47.33

## 2017-08-10 HISTORY — DX: Dependence on other enabling machines and devices: Z99.89

## 2017-08-10 HISTORY — DX: Supraventricular tachycardia: I47.1

## 2017-08-10 HISTORY — PX: CIRCUMCISION: SHX1350

## 2017-08-10 HISTORY — DX: Unspecified osteoarthritis, unspecified site: M19.90

## 2017-08-10 LAB — POCT I-STAT, CHEM 8
BUN: 15 mg/dL (ref 8–23)
CHLORIDE: 105 mmol/L (ref 98–111)
Calcium, Ion: 1.24 mmol/L (ref 1.15–1.40)
Creatinine, Ser: 1.2 mg/dL (ref 0.61–1.24)
Glucose, Bld: 97 mg/dL (ref 70–99)
HCT: 46 % (ref 39.0–52.0)
HEMOGLOBIN: 15.6 g/dL (ref 13.0–17.0)
Potassium: 3.9 mmol/L (ref 3.5–5.1)
Sodium: 145 mmol/L (ref 135–145)
TCO2: 28 mmol/L (ref 22–32)

## 2017-08-10 SURGERY — CIRCUMCISION, ADULT
Anesthesia: General | Site: Penis

## 2017-08-10 MED ORDER — BUPIVACAINE HCL 0.25 % IJ SOLN
INTRAMUSCULAR | Status: DC | PRN
  Administered 2017-08-10: 10 mL

## 2017-08-10 MED ORDER — DEXAMETHASONE SODIUM PHOSPHATE 10 MG/ML IJ SOLN
INTRAMUSCULAR | Status: AC
  Filled 2017-08-10: qty 1

## 2017-08-10 MED ORDER — LACTATED RINGERS IV SOLN
INTRAVENOUS | Status: DC
  Administered 2017-08-10 (×3): via INTRAVENOUS
  Filled 2017-08-10: qty 1000

## 2017-08-10 MED ORDER — ONDANSETRON HCL 4 MG/2ML IJ SOLN
INTRAMUSCULAR | Status: AC
  Filled 2017-08-10: qty 2

## 2017-08-10 MED ORDER — MIDAZOLAM HCL 5 MG/5ML IJ SOLN
INTRAMUSCULAR | Status: DC | PRN
  Administered 2017-08-10: 2 mg via INTRAVENOUS

## 2017-08-10 MED ORDER — FENTANYL CITRATE (PF) 100 MCG/2ML IJ SOLN
INTRAMUSCULAR | Status: AC
  Filled 2017-08-10: qty 2

## 2017-08-10 MED ORDER — OXYCODONE HCL 5 MG/5ML PO SOLN
5.0000 mg | Freq: Once | ORAL | Status: DC | PRN
Start: 2017-08-10 — End: 2017-08-10
  Filled 2017-08-10: qty 5

## 2017-08-10 MED ORDER — CLINDAMYCIN PHOSPHATE 900 MG/50ML IV SOLN
INTRAVENOUS | Status: AC
  Filled 2017-08-10: qty 50

## 2017-08-10 MED ORDER — PROPOFOL 10 MG/ML IV BOLUS
INTRAVENOUS | Status: DC | PRN
  Administered 2017-08-10: 250 mg via INTRAVENOUS
  Administered 2017-08-10: 50 mg via INTRAVENOUS

## 2017-08-10 MED ORDER — LIDOCAINE HCL (PF) 1 % IJ SOLN
INTRAMUSCULAR | Status: DC | PRN
  Administered 2017-08-10: 10 mL

## 2017-08-10 MED ORDER — PROMETHAZINE HCL 25 MG/ML IJ SOLN
6.2500 mg | INTRAMUSCULAR | Status: DC | PRN
  Filled 2017-08-10: qty 1

## 2017-08-10 MED ORDER — SENNOSIDES-DOCUSATE SODIUM 8.6-50 MG PO TABS
1.0000 | ORAL_TABLET | Freq: Two times a day (BID) | ORAL | 0 refills | Status: AC
Start: 2017-08-10 — End: ?

## 2017-08-10 MED ORDER — PROPOFOL 10 MG/ML IV BOLUS
INTRAVENOUS | Status: AC
  Filled 2017-08-10: qty 20

## 2017-08-10 MED ORDER — EPHEDRINE SULFATE-NACL 50-0.9 MG/10ML-% IV SOSY
PREFILLED_SYRINGE | INTRAVENOUS | Status: DC | PRN
  Administered 2017-08-10: 5 mg via INTRAVENOUS

## 2017-08-10 MED ORDER — LACTATED RINGERS IV SOLN
INTRAVENOUS | Status: DC
  Filled 2017-08-10: qty 1000

## 2017-08-10 MED ORDER — LIDOCAINE 2% (20 MG/ML) 5 ML SYRINGE
INTRAMUSCULAR | Status: AC
  Filled 2017-08-10: qty 5

## 2017-08-10 MED ORDER — OXYCODONE HCL 5 MG PO TABS
5.0000 mg | ORAL_TABLET | Freq: Once | ORAL | Status: DC | PRN
  Filled 2017-08-10: qty 1

## 2017-08-10 MED ORDER — PHENYLEPHRINE 40 MCG/ML (10ML) SYRINGE FOR IV PUSH (FOR BLOOD PRESSURE SUPPORT)
PREFILLED_SYRINGE | INTRAVENOUS | Status: DC | PRN
  Administered 2017-08-10: 40 ug via INTRAVENOUS
  Administered 2017-08-10: 80 ug via INTRAVENOUS
  Administered 2017-08-10: 40 ug via INTRAVENOUS

## 2017-08-10 MED ORDER — DEXAMETHASONE SODIUM PHOSPHATE 10 MG/ML IJ SOLN
INTRAMUSCULAR | Status: DC | PRN
  Administered 2017-08-10: 10 mg via INTRAVENOUS

## 2017-08-10 MED ORDER — TRAMADOL HCL 50 MG PO TABS: 50.0000 mg | ORAL_TABLET | Freq: Four times a day (QID) | ORAL | 0 refills | Status: AC | PRN

## 2017-08-10 MED ORDER — MEPERIDINE HCL 25 MG/ML IJ SOLN
6.2500 mg | INTRAMUSCULAR | Status: DC | PRN
  Filled 2017-08-10: qty 1

## 2017-08-10 MED ORDER — FENTANYL CITRATE (PF) 100 MCG/2ML IJ SOLN
INTRAMUSCULAR | Status: DC | PRN
  Administered 2017-08-10: 25 ug via INTRAVENOUS

## 2017-08-10 MED ORDER — HYDROMORPHONE HCL 1 MG/ML IJ SOLN
0.2500 mg | INTRAMUSCULAR | Status: DC | PRN
  Filled 2017-08-10: qty 0.5

## 2017-08-10 MED ORDER — MIDAZOLAM HCL 2 MG/2ML IJ SOLN
INTRAMUSCULAR | Status: AC
  Filled 2017-08-10: qty 2

## 2017-08-10 MED ORDER — CLINDAMYCIN PHOSPHATE 900 MG/50ML IV SOLN
900.0000 mg | INTRAVENOUS | Status: AC
  Administered 2017-08-10 (×2): 900 mg via INTRAVENOUS
  Filled 2017-08-10: qty 50

## 2017-08-10 MED ORDER — ONDANSETRON HCL 4 MG/2ML IJ SOLN
INTRAMUSCULAR | Status: DC | PRN
  Administered 2017-08-10: 4 mg via INTRAVENOUS

## 2017-08-10 SURGICAL SUPPLY — 27 items
BANDAGE COBAN STERILE 2 (GAUZE/BANDAGES/DRESSINGS) ×3 IMPLANT
BLADE SURG 15 STRL LF DISP TIS (BLADE) ×1 IMPLANT
BLADE SURG 15 STRL SS (BLADE) ×3
BNDG CONFORM 2 STRL LF (GAUZE/BANDAGES/DRESSINGS) ×3 IMPLANT
COVER BACK TABLE 60X90IN (DRAPES) ×3 IMPLANT
COVER MAYO STAND STRL (DRAPES) ×3 IMPLANT
DRAPE LAPAROTOMY 100X72 PEDS (DRAPES) ×3 IMPLANT
ELECT NDL TIP 2.8 STRL (NEEDLE) IMPLANT
ELECT NEEDLE TIP 2.8 STRL (NEEDLE) IMPLANT
ELECT REM PT RETURN 9FT ADLT (ELECTROSURGICAL) ×3
ELECTRODE REM PT RTRN 9FT ADLT (ELECTROSURGICAL) ×1 IMPLANT
GAUZE XEROFORM 1X8 LF (GAUZE/BANDAGES/DRESSINGS) ×3 IMPLANT
GLOVE BIO SURGEON STRL SZ7.5 (GLOVE) ×3 IMPLANT
GOWN STRL REUS W/TWL LRG LVL3 (GOWN DISPOSABLE) ×3 IMPLANT
KIT TURNOVER CYSTO (KITS) ×3 IMPLANT
NDL HYPO 25X1 1.5 SAFETY (NEEDLE) ×1 IMPLANT
NEEDLE HYPO 25X1 1.5 SAFETY (NEEDLE) ×3 IMPLANT
NS IRRIG 500ML POUR BTL (IV SOLUTION) ×2 IMPLANT
PACK BASIN DAY SURGERY FS (CUSTOM PROCEDURE TRAY) ×3 IMPLANT
PENCIL BUTTON HOLSTER BLD 10FT (ELECTRODE) ×3 IMPLANT
SUT VIC AB 3-0 SH 27 (SUTURE) ×3
SUT VIC AB 3-0 SH 27X BRD (SUTURE) IMPLANT
SUT VIC AB 3-0 SH 27XBRD (SUTURE) ×1 IMPLANT
SYR CONTROL 10ML LL (SYRINGE) ×3 IMPLANT
TOWEL OR 17X24 6PK STRL BLUE (TOWEL DISPOSABLE) ×3 IMPLANT
TRAY DSU PREP LF (CUSTOM PROCEDURE TRAY) ×3 IMPLANT
WATER STERILE IRR 500ML POUR (IV SOLUTION) IMPLANT

## 2017-08-10 NOTE — Anesthesia Postprocedure Evaluation (Signed)
Anesthesia Post Note  Patient: Robert Arias  Procedure(s) Performed: CIRCUMCISION ADULT (N/A Penis)     Patient location during evaluation: PACU Anesthesia Type: General Level of consciousness: awake and alert and oriented Pain management: pain level controlled Vital Signs Assessment: post-procedure vital signs reviewed and stable Respiratory status: spontaneous breathing, nonlabored ventilation and respiratory function stable Cardiovascular status: blood pressure returned to baseline and stable Postop Assessment: no apparent nausea or vomiting Anesthetic complications: no    Last Vitals:  Vitals:   08/10/17 1415 08/10/17 1425  BP: 139/79   Pulse: (!) 59 61  Resp: 11 15  Temp:    SpO2: 100% 100%    Last Pain:  Vitals:   08/10/17 1445  TempSrc:   PainSc: 0-No pain                 Sedalia Greeson A.

## 2017-08-10 NOTE — Discharge Instructions (Signed)
1 - Stitches are dissolvable. It is OK to shower anytime. Dressing can come off morning of day after surgery.  No sexual stimulation x 2 weeks.   2 - Call MD or go to ER for fever >102, severe pain / nausea / vomiting not relieved by medications, or acute change in medical status  Circumcision-Home Care Instructions  The following instructions have been prepared to help you care for yourself upon your return home today.   Wound Care & Hygiene:   You may apply ice to the penis.  This may help to decrease swelling.  Remove the dressing tomorrow.  If the dressing falls off before then, leave it off.  You may shower or bathe in 48 hours  Gently wash the penis with soap and water.  The stitches do not need to be removed.  Activity:  Do not drive or operate any equipment today.  The effects of anesthesia are still present, drowsiness may result.  Rest today, not necessarily flat bed rest, just take it easy.  You may resume your normal activity in one to two days or as indicated by your physician.  Sexual Activity:  Erection and sexual relations should be avoided for 2 weeks.  Return to Work:  One to two days or as indicated by your physician   Diet:  Drink liquids or eat a very light diet this evening.  You may resume a regular diet tomorrow.  General Expectations of your surgery:   You may have a small amount of bleeding  The penis will be swollen and bruised for approximately one week  You may wake during the night with an erection, usually this is caused by having a full bladder so you should try to urinate (pass your water) to relieve the erection or apply ice to the penis  Unexpected Observations - Call your doctor if these occur!  Persistent or heavy bleeding  Temperature of 101 degrees or more  Severe pain not relieved by medication          Post Anesthesia Home Care Instructions  Activity: Get plenty of rest for the remainder of the day. A responsible individual  must stay with you for 24 hours following the procedure.  For the next 24 hours, DO NOT: -Drive a car -Advertising copywriterperate machinery -Drink alcoholic beverages -Take any medication unless instructed by your physician -Make any legal decisions or sign important papers.  Meals: Start with liquid foods such as gelatin or soup. Progress to regular foods as tolerated. Avoid greasy, spicy, heavy foods. If nausea and/or vomiting occur, drink only clear liquids until the nausea and/or vomiting subsides. Call your physician if vomiting continues.  Special Instructions/Symptoms: Your throat may feel dry or sore from the anesthesia or the breathing tube placed in your throat during surgery. If this causes discomfort, gargle with warm salt water. The discomfort should disappear within 24 hours.  If you had a scopolamine patch placed behind your ear for the management of post- operative nausea and/or vomiting:  1. The medication in the patch is effective for 72 hours, after which it should be removed.  Wrap patch in a tissue and discard in the trash. Wash hands thoroughly with soap and water. 2. You may remove the patch earlier than 72 hours if you experience unpleasant side effects which may include dry mouth, dizziness or visual disturbances. 3. Avoid touching the patch. Wash your hands with soap and water after contact with the patch.

## 2017-08-10 NOTE — Anesthesia Preprocedure Evaluation (Signed)
Anesthesia Evaluation  Patient identified by MRN, date of birth, ID band Patient awake    Reviewed: Allergy & Precautions, NPO status , Patient's Chart, lab work & pertinent test results  Airway Mallampati: II  TM Distance: >3 FB Neck ROM: Full    Dental  (+) Teeth Intact, Dental Advisory Given   Pulmonary sleep apnea and Continuous Positive Airway Pressure Ventilation , former smoker,    breath sounds clear to auscultation       Cardiovascular hypertension, Pt. on medications  Rhythm:Regular Rate:Normal     Neuro/Psych PSYCHIATRIC DISORDERS Anxiety    GI/Hepatic negative GI ROS, Neg liver ROS,   Endo/Other  negative endocrine ROS  Renal/GU negative Renal ROS     Musculoskeletal  (+) Arthritis ,   Abdominal Normal abdominal exam  (+)   Peds  Hematology negative hematology ROS (+)   Anesthesia Other Findings - PTSD  Reproductive/Obstetrics                             Anesthesia Physical Anesthesia Plan  ASA: II  Anesthesia Plan: General   Post-op Pain Management:    Induction: Intravenous  PONV Risk Score and Plan: 3 and Ondansetron, Treatment may vary due to age or medical condition and Midazolam  Airway Management Planned: LMA  Additional Equipment: None  Intra-op Plan:   Post-operative Plan: Extubation in OR  Informed Consent: I have reviewed the patients History and Physical, chart, labs and discussed the procedure including the risks, benefits and alternatives for the proposed anesthesia with the patient or authorized representative who has indicated his/her understanding and acceptance.   Dental advisory given  Plan Discussed with: CRNA  Anesthesia Plan Comments:         Anesthesia Quick Evaluation

## 2017-08-10 NOTE — Op Note (Signed)
NAMArletta Bale: Sager, [name] MEDICAL RECORD [id number] ACCOUNT [id number] DATE OF BIRTH:13-Sep-1950 FACILITY: [facility] LOCATION: [facility] PHYSICIAN:Christopherjohn Schiele Berneice HeinrichMANNY, MD  OPERATIVE REPORT  DATE OF PROCEDURE:  08/10/2017  PREOPERATIVE DIAGNOSIS:  Phimosis.  POSTOPERATIVE DIAGNOSIS:  Resolution of phimosis.  PROCEDURE:   1.  Circumcision. 2.  Penile block.  SURGEON:  Sebastian Acheheodore Jalani Cullifer, MD  ESTIMATED BLOOD LOSS:  Nil.  MEDICATIONS:  None.  SPECIMENS:  Foreskin for discard.  FINDINGS:  Mild phimosis.  INDICATIONS:  The patient is a pleasant 67 year old gentleman with progressive bothersome phimosis; is interfering with hygiene and inability to avoid.  He has elected circumcision as definitive management.  Informed consent was obtained and placed in  medical record.  PROCEDURE PERFORMED:  The patient identified as being Robert Arias, procedure of circumcision was confirmed.  Procedure timeout was performed.  Intravenous antibiotics administered.  General LMA anesthesia induced.  The patient was placed into a  supine position.  A sterile field was created by prepping and draping the patient's penis, proximal thighs, lower abdomen and the scrotum using iodine, taking great care to also prep the inner foreskin leaflet, which was retractile.  The outer foreskin  leaflet was marked to the level of the unstretched corona of the glans using a marking pen and a proximal collar was formed approximately 7 mm proximal to the corona of the glans and the previous collar also circumscribed.  These were connected in the  midline and the redundant preputial tissue was removed from the underlying tissue using cautery dissection.  Point hemostasis was achieved using coagulation current cautery.  The 12 o'clock position was reapproximated with a single Vicryl stitch and a  U-stitch was placed at the area of the frenulum and 2 separate suture lines of running Vicryl were used on each side, reapproximating the skin  edges.  This resulted in excellent cosmesis, complete resolution of all phimosis.  Attention was directed to  penile block; 20 mL of a 50% lidocaine and 0.5% Marcaine slurry was used in a ring block type fashion at the area of the base of the penis.  Dressing of Kling and Coban was applied.  Procedure terminated.    The patient tolerated the procedure well.  No immediate apparent complications.  The patient was then transferred in stable condition.  AN/NUANCE  D:08/10/2017 T:08/10/2017 JOB:[id number]

## 2017-08-10 NOTE — Brief Op Note (Signed)
08/10/2017  1:15 PM  PATIENT:  Robert Arias  67 y.o. male  PRE-OPERATIVE DIAGNOSIS:  PHIMOSIS  POST-OPERATIVE DIAGNOSIS:  PHIMOSIS  PROCEDURE:  Procedure(s): CIRCUMCISION ADULT (N/A)  SURGEON:  Surgeon(s) and Role:    * Sebastian AcheManny, Marissah Vandemark, MD - Primary  PHYSICIAN ASSISTANT:   ASSISTANTS: none   ANESTHESIA:   general  EBL:  5 mL   BLOOD ADMINISTERED:none  DRAINS: none   LOCAL MEDICATIONS USED:  MARCAINE    and LIDOCAINE   SPECIMEN:  Source of Specimen:  foreskin  DISPOSITION OF SPECIMEN:  discard  COUNTS:  YES  TOURNIQUET:  * No tourniquets in log *  DICTATION: .Other Dictation: Dictation Number [id number]  PLAN OF CARE: Discharge to home after PACU  PATIENT DISPOSITION:  PACU - hemodynamically stable.   Delay start of Pharmacological VTE agent (>24hrs) due to surgical blood loss or risk of bleeding: yes

## 2017-08-10 NOTE — H&P (Signed)
Robert BaleCharles Arias is an 67 y.o. male.    Chief Complaint: Pre-OP Circumcision  HPI:   1 - Phimosis - progressive bother from tightning of foreskin, making hygeine more difficult. Can still rectract but with some difficulty. He adamntly want circumcision. Non-diabetic.   PMH sig for arrtyhmia / ablation (no strong blood thinners, follows Dr. Sharyn LullHarwani), HLD, HTN, Mild PTSD. [facility]   Today " Robert Arias " is seen to proceed with circumcision.    Past Medical History:  Diagnosis Date  . Arthritis    right wrist  . Hyperlipidemia   . Hypertension   . OSA on CPAP   . Phimosis   . 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 PTSD (post-traumatic stress disorder)   . Wears glasses     Past Surgical History:  Procedure Laterality Date  . CARDIAC ELECTROPHYSIOLOGY MAPPING AND ABLATION  11-25-2008    [facility]   successful ablation AV nodal reentry tachycardia  . CARDIOVASCULAR STRESS TEST  10/30/2008   normal nuclear study w/ no ischemia/  normal LV function and wall motion , ef 50%  . KNEE ARTHROSCOPY Bilateral yrs ago  . TOTAL KNEE ARTHROPLASTY Bilateral 2012;  2013  . TRANSTHORACIC ECHOCARDIOGRAM  11/23/2008   ef 60%,  grade 1 diastlic dysfunction/  mild LAE/  mild PR and TR    History reviewed. No pertinent family history. Social History:  reports that he quit smoking about 31 years ago. His smoking use included cigarettes. He quit after 3.00 years of use. He has never used smokeless tobacco. He reports that he does not drink alcohol or use drugs.  Allergies: No Known Allergies  No medications prior to admission.    No results found for this or any previous visit (from the past 48 hour(s)). No results found.  Review of Systems  Constitutional: Negative.  Negative for chills and fever.  HENT: Negative.   Eyes: Negative.   Respiratory: Negative.    Cardiovascular: Negative.   Gastrointestinal: Negative.   Genitourinary: Negative.   Musculoskeletal: Negative.   Skin: Negative.   Neurological: Negative.   Endo/Heme/Allergies: Negative.   Psychiatric/Behavioral: Negative.     Height 6\' 4"  (1.93 m), weight 111.1 kg (245 lb). Physical Exam  Constitutional: He appears well-developed.  HENT:  Head: Normocephalic.  Neck: Normal range of motion.  Cardiovascular: Normal rate.  Genitourinary:  Genitourinary Comments: Severe phimosis  Musculoskeletal: Normal range of motion.  Neurological: He is alert.  Skin: Skin is warm.  Psychiatric: He has a normal mood and affect.     Assessment/Plan  1 - Phimosis - Proceed as planned with circumcision. Risks, benefits, alternatives, expected peri-op course discussed previously and reiterated today.   Robert Arias, Robert Rizo, MD 08/10/2017, 8:09 AM

## 2017-08-10 NOTE — Transfer of Care (Signed)
Immediate Anesthesia Transfer of Care Note  Patient: Robert Arias  Procedure(s) Performed: CIRCUMCISION ADULT (N/A Penis)  Patient Location: PACU  Anesthesia Type:General  Level of Consciousness: sedated and responds to stimulation  Airway & Oxygen Therapy: Patient Spontanous Breathing and Patient connected to nasal cannula oxygen  Post-op Assessment: Report given to RN  Post vital signs: Reviewed and stable  Last Vitals:  Vitals Value Taken Time  BP 122/68 08/10/2017  1:33 PM  Temp 36.8 C 08/10/2017  1:33 PM  Pulse 76 08/10/2017  1:36 PM  Resp 14 08/10/2017  1:36 PM  SpO2 100 % 08/10/2017  1:36 PM  Vitals shown include unvalidated device data.  Last Pain:  Vitals:   08/10/17 1134  TempSrc:   PainSc: 0-No pain      Patients Stated Pain Goal: 6 (08/10/17 1127)  Complications: No apparent anesthesia complications

## 2017-08-10 NOTE — Anesthesia Procedure Notes (Signed)
Procedure Name: LMA Insertion Date/Time: 08/10/2017 12:48 PM Performed by: Briant Sitesenenny, Filicia Scogin T, CRNA Pre-anesthesia Checklist: Patient identified, Emergency Drugs available, Suction available and Patient being monitored Patient Re-evaluated:Patient Re-evaluated prior to induction Oxygen Delivery Method: Circle system utilized Preoxygenation: Pre-oxygenation with 100% oxygen Induction Type: IV induction Ventilation: Mask ventilation without difficulty LMA: LMA inserted LMA Size: 5.0 Number of attempts: 1 Airway Equipment and Method: Bite block Placement Confirmation: positive ETCO2 Tube secured with: Tape Dental Injury: Teeth and Oropharynx as per pre-operative assessment

## 2017-08-13 ENCOUNTER — Encounter (HOSPITAL_BASED_OUTPATIENT_CLINIC_OR_DEPARTMENT_OTHER): Payer: Self-pay | Admitting: Urology

## 2017-08-27 DIAGNOSIS — R3911 Hesitancy of micturition: Secondary | ICD-10-CM | POA: Diagnosis not present

## 2017-08-27 DIAGNOSIS — N471 Phimosis: Secondary | ICD-10-CM | POA: Diagnosis not present

## 2017-08-27 DIAGNOSIS — N5201 Erectile dysfunction due to arterial insufficiency: Secondary | ICD-10-CM | POA: Diagnosis not present

## 2017-08-27 DIAGNOSIS — N401 Enlarged prostate with lower urinary tract symptoms: Secondary | ICD-10-CM | POA: Diagnosis not present

## 2018-06-26 DIAGNOSIS — I10 Essential (primary) hypertension: Secondary | ICD-10-CM | POA: Diagnosis not present

## 2018-06-26 DIAGNOSIS — E785 Hyperlipidemia, unspecified: Secondary | ICD-10-CM | POA: Diagnosis not present

## 2018-07-10 DIAGNOSIS — I10 Essential (primary) hypertension: Secondary | ICD-10-CM | POA: Diagnosis not present

## 2018-07-10 DIAGNOSIS — N529 Male erectile dysfunction, unspecified: Secondary | ICD-10-CM | POA: Diagnosis not present

## 2018-07-10 DIAGNOSIS — F4310 Post-traumatic stress disorder, unspecified: Secondary | ICD-10-CM | POA: Diagnosis not present

## 2018-07-10 DIAGNOSIS — Z8249 Family history of ischemic heart disease and other diseases of the circulatory system: Secondary | ICD-10-CM | POA: Diagnosis not present

## 2018-07-10 DIAGNOSIS — E785 Hyperlipidemia, unspecified: Secondary | ICD-10-CM | POA: Diagnosis not present

## 2019-01-08 DIAGNOSIS — I471 Supraventricular tachycardia: Secondary | ICD-10-CM | POA: Diagnosis not present

## 2019-01-08 DIAGNOSIS — E785 Hyperlipidemia, unspecified: Secondary | ICD-10-CM | POA: Diagnosis not present

## 2019-01-08 DIAGNOSIS — I10 Essential (primary) hypertension: Secondary | ICD-10-CM | POA: Diagnosis not present

## 2020-01-14 ENCOUNTER — Emergency Department (HOSPITAL_BASED_OUTPATIENT_CLINIC_OR_DEPARTMENT_OTHER)
Admission: EM | Admit: 2020-01-14 | Discharge: 2020-01-14 | Disposition: A | Discharge status: Home / Self Care | Payer: Medicare HMO | Attending: Emergency Medicine | Admitting: Emergency Medicine

## 2020-01-14 ENCOUNTER — Encounter (HOSPITAL_BASED_OUTPATIENT_CLINIC_OR_DEPARTMENT_OTHER): Payer: Self-pay | Admitting: *Deleted

## 2020-01-14 ENCOUNTER — Emergency Department (HOSPITAL_BASED_OUTPATIENT_CLINIC_OR_DEPARTMENT_OTHER): Payer: Medicare HMO

## 2020-01-14 ENCOUNTER — Other Ambulatory Visit: Payer: Self-pay

## 2020-01-14 DIAGNOSIS — U071 COVID-19: Secondary | ICD-10-CM | POA: Diagnosis not present

## 2020-01-14 DIAGNOSIS — Z96653 Presence of artificial knee joint, bilateral: Secondary | ICD-10-CM | POA: Diagnosis not present

## 2020-01-14 DIAGNOSIS — Z79899 Other long term (current) drug therapy: Secondary | ICD-10-CM | POA: Diagnosis not present

## 2020-01-14 DIAGNOSIS — Z87891 Personal history of nicotine dependence: Secondary | ICD-10-CM | POA: Insufficient documentation

## 2020-01-14 DIAGNOSIS — Z7982 Long term (current) use of aspirin: Secondary | ICD-10-CM | POA: Insufficient documentation

## 2020-01-14 DIAGNOSIS — B349 Viral infection, unspecified: Secondary | ICD-10-CM | POA: Insufficient documentation

## 2020-01-14 DIAGNOSIS — Z20822 Contact with and (suspected) exposure to covid-19: Secondary | ICD-10-CM | POA: Insufficient documentation

## 2020-01-14 DIAGNOSIS — I10 Essential (primary) hypertension: Secondary | ICD-10-CM | POA: Diagnosis not present

## 2020-01-14 DIAGNOSIS — R509 Fever, unspecified: Secondary | ICD-10-CM | POA: Diagnosis present

## 2020-01-14 LAB — CBC WITH DIFFERENTIAL/PLATELET
Abs Immature Granulocytes: 0.02 10*3/uL (ref 0.00–0.07)
Basophils Absolute: 0 10*3/uL (ref 0.0–0.1)
Basophils Relative: 1 %
Eosinophils Absolute: 0 10*3/uL (ref 0.0–0.5)
Eosinophils Relative: 0 %
HCT: 43.4 % (ref 39.0–52.0)
Hemoglobin: 14.5 g/dL (ref 13.0–17.0)
Immature Granulocytes: 0 %
Lymphocytes Relative: 24 %
Lymphs Abs: 1.3 10*3/uL (ref 0.7–4.0)
MCH: 29.9 pg (ref 26.0–34.0)
MCHC: 33.4 g/dL (ref 30.0–36.0)
MCV: 89.5 fL (ref 80.0–100.0)
Monocytes Absolute: 1.2 10*3/uL — ABNORMAL HIGH (ref 0.1–1.0)
Monocytes Relative: 22 %
Neutro Abs: 2.9 10*3/uL (ref 1.7–7.7)
Neutrophils Relative %: 53 %
Platelets: 155 10*3/uL (ref 150–400)
RBC: 4.85 MIL/uL (ref 4.22–5.81)
RDW: 14.1 % (ref 11.5–15.5)
WBC: 5.4 10*3/uL (ref 4.0–10.5)
nRBC: 0 % (ref 0.0–0.2)

## 2020-01-14 LAB — URINALYSIS, ROUTINE W REFLEX MICROSCOPIC
Bilirubin Urine: NEGATIVE
Glucose, UA: NEGATIVE mg/dL
Hgb urine dipstick: NEGATIVE
Ketones, ur: NEGATIVE mg/dL
Leukocytes,Ua: NEGATIVE
Nitrite: NEGATIVE
Protein, ur: NEGATIVE mg/dL
Specific Gravity, Urine: 1.03 (ref 1.005–1.030)
pH: 5 (ref 5.0–8.0)

## 2020-01-14 LAB — COMPREHENSIVE METABOLIC PANEL
ALT: 20 U/L (ref 0–44)
AST: 27 U/L (ref 15–41)
Albumin: 4.6 g/dL (ref 3.5–5.0)
Alkaline Phosphatase: 90 U/L (ref 38–126)
Anion gap: 11 (ref 5–15)
BUN: 18 mg/dL (ref 8–23)
CO2: 25 mmol/L (ref 22–32)
Calcium: 9.4 mg/dL (ref 8.9–10.3)
Chloride: 100 mmol/L (ref 98–111)
Creatinine, Ser: 1.46 mg/dL — ABNORMAL HIGH (ref 0.61–1.24)
GFR, Estimated: 51 mL/min — ABNORMAL LOW (ref >60)
Glucose, Bld: 91 mg/dL (ref 70–99)
Potassium: 4 mmol/L (ref 3.5–5.1)
Sodium: 136 mmol/L (ref 135–145)
Total Bilirubin: 1.2 mg/dL (ref 0.3–1.2)
Total Protein: 8.4 g/dL — ABNORMAL HIGH (ref 6.5–8.1)

## 2020-01-14 LAB — GROUP A STREP BY PCR: Group A Strep by PCR: NOT DETECTED

## 2020-01-14 MED ORDER — ACETAMINOPHEN 325 MG PO TABS
650.0000 mg | ORAL_TABLET | Freq: Once | ORAL | Status: AC
  Administered 2020-01-14: 650 mg via ORAL

## 2020-01-14 MED ORDER — ACETAMINOPHEN 325 MG PO TABS
ORAL_TABLET | ORAL | Status: AC
  Filled 2020-01-14: qty 2

## 2020-01-14 NOTE — ED Provider Notes (Signed)
[facility] EMERGENCY DEPARTMENT Provider Note   CSN: [id number] Arrival date & time: 01/14/20  1332     History Chief Complaint  Patient presents with  . Fever  . Chills  . Cough    Robert Arias is a 70 y.o. male.  HPI   Patient with significant medical history of hypertension, sleep apnea, SVT presents to the emergency department with chief complaint of fever and a sore throat.  Patient states yesterday he was working outside in the cold and started to develop a sore throat.  He  woke up this morning with worsening sore throat, nasal congestion, headache, but denies chest pain, shortness of breath, nausea, vomiting, diarrhea.  He endorses that he is vaccine against COVID-19 and influenza, denies recent sick contacts, is not immunocompromise.  He denies any other symptoms at this time.  Denies alleviating factors  Past Medical History:  Diagnosis Date  . Arthritis    right wrist  . Hyperlipidemia   . Hypertension   . OSA on CPAP   . Phimosis   . PSVT (paroxysmal supraventricular tachycardia) (HCC) 2010   11-25-2008  s/p  EP w/ ablation for AV nodal reentry tachycardia by dr Graciela Husbands  . PTSD (post-traumatic stress disorder)   . Wears glasses     Patient Active Problem List   Diagnosis Date Noted  . PALPITATIONS 02/25/2009  . HYPERTENSION, BENIGN 11/12/2008  . INTERMEDIATE CORONARY SYNDROME 11/12/2008    Past Surgical History:  Procedure Laterality Date  . CARDIAC ELECTROPHYSIOLOGY MAPPING AND ABLATION  11-25-2008    dr Graciela Husbands  [facility]   successful ablation AV nodal reentry tachycardia  . CARDIOVASCULAR STRESS TEST  10/30/2008   normal nuclear study w/ no ischemia/  normal LV function and wall motion , ef 50%  . CIRCUMCISION N/A 08/10/2017   Procedure: CIRCUMCISION ADULT;  Surgeon: Sebastian Ache, MD;  Location: [facility];  Service: Urology;  Laterality: N/A;  . KNEE ARTHROSCOPY Bilateral yrs ago  . TOTAL KNEE ARTHROPLASTY Bilateral 2012;  2013   . TRANSTHORACIC ECHOCARDIOGRAM  11/23/2008   ef 60%,  grade 1 diastlic dysfunction/  mild LAE/  mild PR and TR       History reviewed. No pertinent family history.  Social History   Tobacco Use  . Smoking status: Former Smoker    Years: 3.00    Types: Cigarettes    Quit date: 08/08/1986    Years since quitting: 33.4  . Smokeless tobacco: Never Used  Substance Use Topics  . Alcohol use: No  . Drug use: Never    Home Medications Prior to Admission medications   Medication Sig Start Date End Date Taking? Authorizing Provider  aspirin 81 MG tablet Take 81 mg by mouth daily.    [provider]  Cholecalciferol (VITAMIN D-3) 1000 units CAPS Take by mouth daily.    [provider]  hydrochlorothiazide (HYDRODIURIL) 25 MG tablet Take 25 mg by mouth every morning.     [provider]  losartan (COZAAR) 100 MG tablet Take 50 mg by mouth every morning.    [provider]  prazosin (MINIPRESS) 1 MG capsule Take 1 mg by mouth as needed (FOR ANXIETY (PT HAS PTSD)).    [provider]  senna-docusate (SENOKOT-S) 8.6-50 MG tablet Take 1 tablet by mouth 2 (two) times daily. While taking strong pain meds to prevent constipation 08/10/17   Sebastian Ache, MD  sertraline (ZOLOFT) 100 MG tablet Take 100 mg by mouth at bedtime.  [provider]  simvastatin (ZOCOR) 80 MG tablet Take 40 mg by mouth every morning.    [provider]  traMADol (ULTRAM) 50 MG tablet Take 1-2 tablets (50-100 mg total) by mouth every 6 (six) hours as needed for moderate pain or severe pain. Post-operatively 08/10/17   Sebastian Ache, MD    Allergies    Patient has no known allergies.  Review of Systems   Review of Systems  Constitutional: Negative for chills and fever.  HENT: Positive for congestion and sore throat.   Eyes: Negative for visual disturbance.  Respiratory: Negative for cough and shortness of breath.   Cardiovascular: Negative for chest  pain.  Gastrointestinal: Negative for abdominal pain, diarrhea, nausea and vomiting.  Genitourinary: Positive for frequency. Negative for enuresis, penile discharge and penile pain.  Musculoskeletal: Negative for myalgias.  Skin: Negative for rash.  Neurological: Positive for light-headedness. Negative for dizziness and headaches.  Hematological: Does not bruise/bleed easily.    Physical Exam Updated Vital Signs BP 130/82   Pulse 87   Temp 98.8 F (37.1 C) (Oral)   Resp 19   Ht 6\' 4"  (1.93 m)   Wt 111.1 kg   SpO2 96%   BMI 29.82 kg/m   Physical Exam Vitals and nursing note reviewed.  Constitutional:      General: He is not in acute distress.    Appearance: He is not ill-appearing.  HENT:     Head: Normocephalic and atraumatic.     Right Ear: Tympanic membrane, ear canal and external ear normal.     Left Ear: Tympanic membrane, ear canal and external ear normal.     Nose: No congestion.     Comments: Patient had bilateral erythematous turbinates, no nasal congestion noted on exam    Mouth/Throat:     Mouth: Mucous membranes are moist.     Pharynx: Oropharynx is clear. No oropharyngeal exudate or posterior oropharyngeal erythema.     Comments: Tongue and uvula are both midline, there is no edema or erythema noted in the posterior pharynx, no exudates present on tonsils. Eyes:     Conjunctiva/sclera: Conjunctivae normal.  Cardiovascular:     Rate and Rhythm: Normal rate and regular rhythm.     Pulses: Normal pulses.     Heart sounds: No murmur heard. No friction rub. No gallop.   Pulmonary:     Effort: No respiratory distress.     Breath sounds: No wheezing, rhonchi or rales.  Abdominal:     Palpations: Abdomen is soft.     Tenderness: There is no abdominal tenderness. There is no guarding.  Musculoskeletal:     Right lower leg: No edema.     Left lower leg: No edema.     Comments: Patient is moving all 4 extremities at difficulty.  Skin:    General: Skin is warm  and dry.  Neurological:     Mental Status: He is alert.  Psychiatric:        Mood and Affect: Mood normal.     ED Results / Procedures / Treatments   Labs (all labs ordered are listed, but only abnormal results are displayed) Labs Reviewed  COMPREHENSIVE METABOLIC PANEL - Abnormal; Notable for the following components:      Result Value   Creatinine, Ser 1.46 (*)    Total Protein 8.4 (*)    GFR, Estimated 51 (*)    All other components within normal limits  CBC WITH DIFFERENTIAL/PLATELET - Abnormal; Notable for the following  components:   Monocytes Absolute 1.2 (*)    All other components within normal limits  GROUP A STREP BY PCR  SARS CORONAVIRUS 2 (TAT 6-24 HRS)  URINALYSIS, ROUTINE W REFLEX MICROSCOPIC    EKG None  Radiology DG Chest Port 1 View  Result Date: 01/14/2020 CLINICAL DATA:  Cough and fever EXAM: PORTABLE CHEST 1 VIEW COMPARISON:  October 30, 2008 FINDINGS: Lungs are clear. Heart size and pulmonary vascularity are normal. No adenopathy. There is aortic atherosclerosis. No bone lesions. IMPRESSION: Lungs clear.  Heart size normal. Aortic Atherosclerosis (ICD10-I70.0). Electronically Signed   By: Lowella Grip III M.D.   On: 01/14/2020 18:05    Procedures Procedures (including critical care time)  Medications Ordered in ED Medications  acetaminophen (TYLENOL) tablet 650 mg (650 mg Oral Given 01/14/20 1433)    ED Course  I have reviewed the triage vital signs and the nursing notes.  Pertinent labs & imaging results that were available during my care of the patient were reviewed by me and considered in my medical decision making (see chart for details).    MDM Rules/Calculators/A&P                          Patient presents with pharyngitis and a headache.  He is alert, does not appear in acute distress, vital signs reassuring.  Will obtain basic lab work, chest x-ray, urine analysis for further evaluation  CBC negative for leukocytosis or signs of  anemia.  CMP negative for electrolyte abnormalities, no metabolic acidosis, slight increase in creatinine 1.46, no elevated liver enzymes, no anion gap present.  Strep test is negative UA negative for nitrates or leukocytes.  Chest x-ray does not reveal any acute findings.  Low suspicion for systemic infection as patient is nontoxic-appearing, vital signs reassuring, no obvious source infection on my exam.  Low suspicion for pneumonia as lung sounds are clear bilaterally, no acute findings seen on chest x-ray.  Low suspicion for intra-abdominal abnormality requiring immediate interventions patient's abdomen soft nontender to palpation, tolerating p.o. with difficulty.  Low suspicion for UTI or pyelonephritis as patient denies urinary symptoms, negative CVA tenderness, UA negative for infection.  I suspect patient suffering from a viral URI, will recommend over-the-counter pain medications, follow-up with PCP for further evaluation.  If Covid positive will refer patient to infusion clinic as he meets criteria.  Vital signs have remained stable, no indication for hospital admission.  Patient discussed with attending and they agreed with assessment and plan.  Patient given at home care as well strict return precautions.  Patient verbalized that they understood agreed to said plan.    Final Clinical Impression(s) / ED Diagnoses Final diagnoses:  Viral illness    Rx / DC Orders ED Discharge Orders    None       Aron Baba 01/14/20 1953    Quintella Reichert, MD 01/14/20 [phone]

## 2020-01-14 NOTE — Discharge Instructions (Addendum)
You have been seen here for URI like symptoms.  I recommend taking Tylenol for fever control and ibuprofen for pain control please follow dosing on the back of bottle.  I recommend staying hydrated and if you do not an appetite, I recommend soups as this will provide you with fluids and calories.  Your Covid test is pending I recommend self quarantine until you get your results back on MyChart.  If you are Covid positive you must self quarantine for 5 days starting on symptom onset.  If at the end of those 5 days she continued have symptoms must self quarantine for additional 5 days.    I would like you to contact "post Covid care"  if you are covid positive as they will provide you with information how to manage your Covid symptoms.  If you are Covid negative and so continue of symptoms after 1 week's time I recommend follow-up with your primary care provider   Come back to the emergency department if you develop chest pain, shortness of breath, severe abdominal pain, uncontrolled nausea, vomiting, diarrhea.

## 2020-01-14 NOTE — ED Triage Notes (Signed)
Fever of 102, Chills and coughing started last night

## 2020-01-15 LAB — SARS CORONAVIRUS 2 (TAT 6-24 HRS): SARS Coronavirus 2: POSITIVE — AB

## 2020-01-16 ENCOUNTER — Telehealth (HOSPITAL_COMMUNITY): Payer: Self-pay

## 2020-01-16 ENCOUNTER — Telehealth: Payer: Self-pay

## 2020-01-16 NOTE — Telephone Encounter (Signed)
Attempted to reach pt. In regard to MAB. Left message with Infusion Hotline number [phone].

## 2021-02-27 IMAGING — DX DG CHEST 1V PORT
1 series · 1 of 1 positions shown · non-contrast
Comparison: October 30, 2008

CLINICAL DATA: Cough and fever

EXAM:
PORTABLE CHEST 1 VIEW

[chest ap]
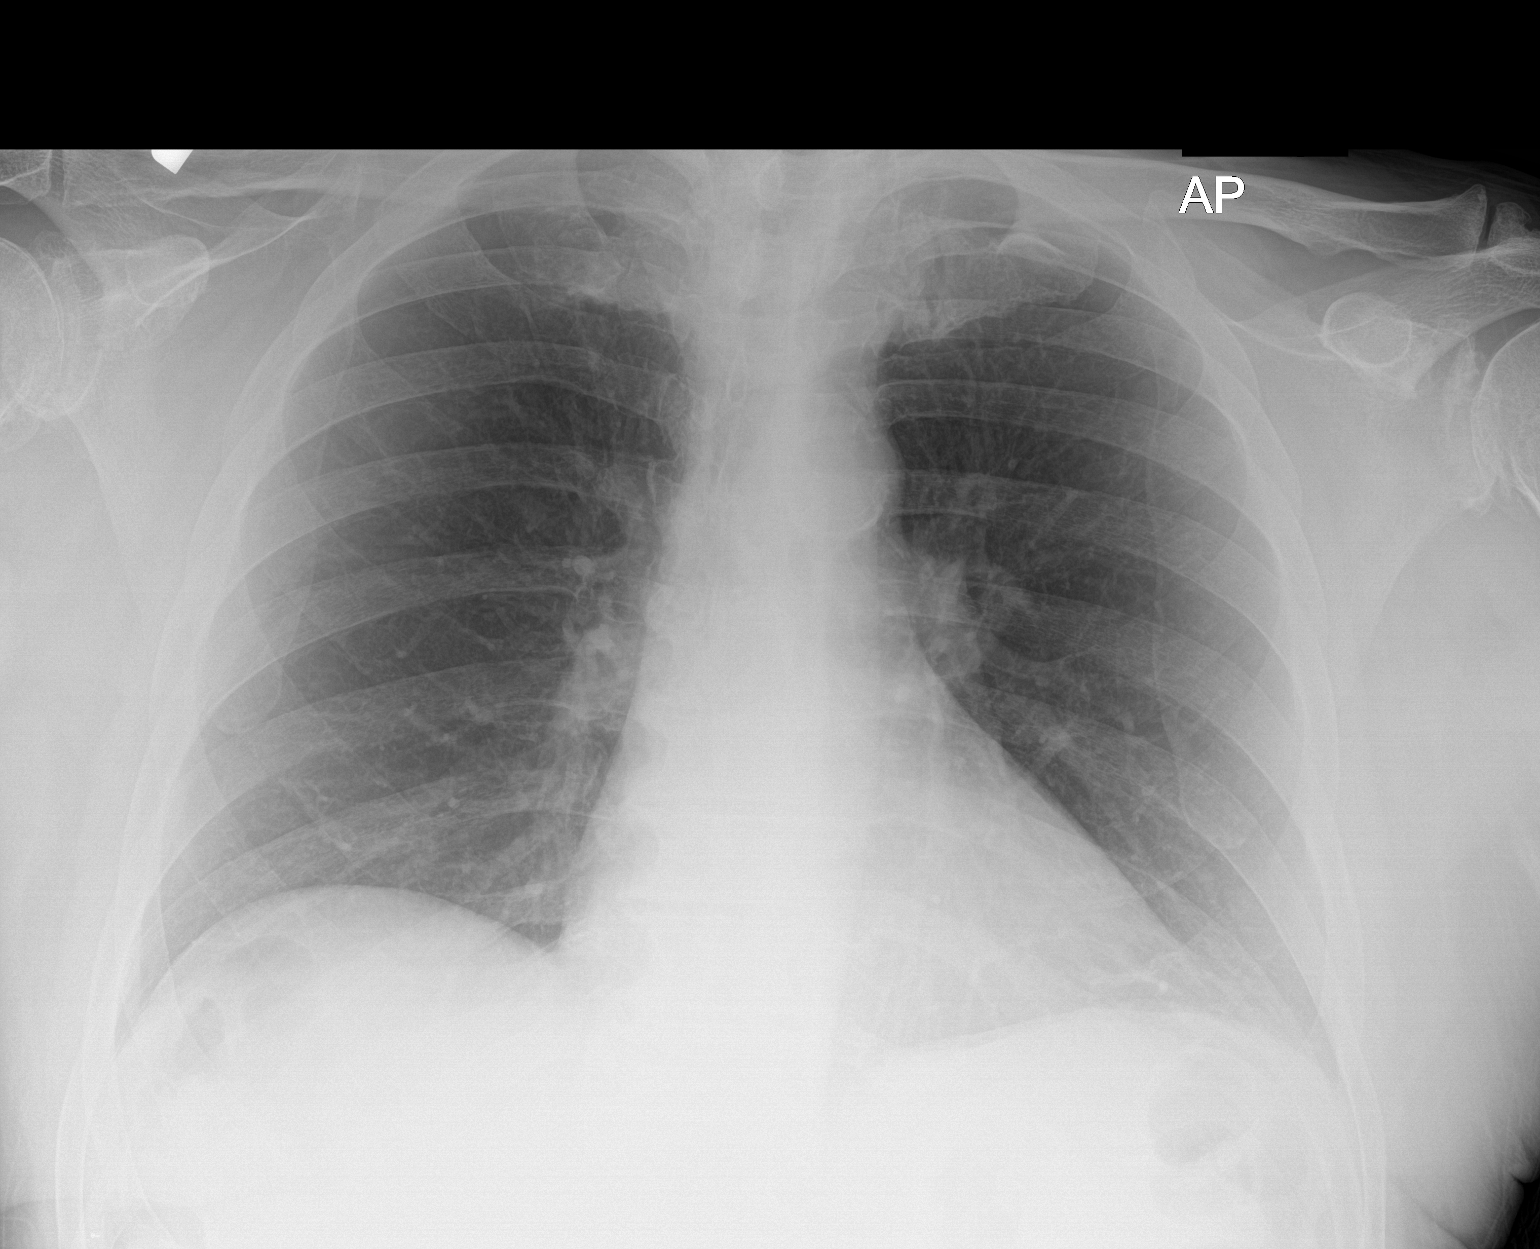

[1 of 1 positions shown; findings below may reference images not displayed]

FINDINGS: Lungs are clear. Heart size and pulmonary vascularity are normal. No
adenopathy. There is aortic atherosclerosis. No bone lesions.
IMPRESSION: Lungs clear.  Heart size normal.

Aortic Atherosclerosis ([id number]).

## 2021-03-21 ENCOUNTER — Ambulatory Visit: Payer: Medicare HMO | Admitting: Medical

## 2022-01-09 LAB — HM COLONOSCOPY

## 2022-02-02 ENCOUNTER — Ambulatory Visit: Payer: Medicare HMO | Admitting: Family

## 2022-02-07 ENCOUNTER — Encounter: Payer: Medicare HMO | Admitting: Family

## 2022-02-28 ENCOUNTER — Ambulatory Visit: Payer: Medicare HMO | Admitting: Family

## 2022-05-04 ENCOUNTER — Ambulatory Visit: Payer: Medicare HMO | Admitting: Family

## 2022-05-04 ENCOUNTER — Encounter: Payer: Self-pay | Admitting: Family

## 2022-05-18 ENCOUNTER — Ambulatory Visit: Payer: Medicare HMO | Admitting: Adult Health

## 2022-05-23 ENCOUNTER — Ambulatory Visit: Payer: Medicare HMO | Admitting: Family

## 2023-04-26 ENCOUNTER — Encounter: Payer: Medicare HMO | Admitting: Family

## 2023-04-29 NOTE — Progress Notes (Signed)
   This encounter was created in error - please disregard. No show

## 2024-01-30 ENCOUNTER — Encounter (INDEPENDENT_AMBULATORY_CARE_PROVIDER_SITE_OTHER): Payer: Self-pay

## 2024-01-30 ENCOUNTER — Ambulatory Visit (INDEPENDENT_AMBULATORY_CARE_PROVIDER_SITE_OTHER)

## 2024-01-30 VITALS — BP 136/87 | HR 65 | Ht 76.0 in | Wt 230.0 lb

## 2024-01-30 DIAGNOSIS — J3489 Other specified disorders of nose and nasal sinuses: Secondary | ICD-10-CM | POA: Diagnosis not present

## 2024-01-30 DIAGNOSIS — J340 Abscess, furuncle and carbuncle of nose: Secondary | ICD-10-CM

## 2024-01-30 MED ORDER — MUPIROCIN 2 % EX OINT: 1.0000 | TOPICAL_OINTMENT | Freq: Two times a day (BID) | CUTANEOUS | 3 refills | Status: AC

## 2024-01-30 NOTE — Progress Notes (Signed)
 Dear Robert Arias, Here is my assessment for our mutual patient, Robert Arias. Thank you for allowing me the opportunity to care for your patient. Please do not hesitate to contact me should you have any other questions. Sincerely, Dr. Hadassah Arias  Otolaryngology Clinic Note Referring provider: Dr. Gust HPI:   Initial HPI (01/30/2024)  74 year old male presenting for evaluation of chronic nasal mucosal irritation  He has had chronic irritation in the left nasal cavity.  This has gone on for years but feels like it is worsened.  He thinks he might of scratched his nose at one point and this is what caused it.  He was started on Bactroban  and this helped the area at the front of his nose but then was using a Q-tip and felt an area more posteriorly that he is concerned about.  States as long as he uses Bactroban  it seems to be helpful but when he does not use it he has some pain here.  Feels a lump at that site.  He uses a Q-tip every day to apply Bactroban  and sticks this posteriorly into his nasal cavity and rubs it on the septum posterior superiorly  Supposed to get shoulder surgery soon wondering if this could have any impact on shoulder surgery.   Independent Review of Additional Tests or Records:  01/23/24 Referral note Robert Gust, MD: left nare erythema and edema, been there for years.    PMH/Meds/All/SocHx/FamHx/ROS:   Past Medical History:  Diagnosis Date   Arthritis    right wrist   Hyperlipidemia    Hypertension    OSA on CPAP    Phimosis    PSVT (paroxysmal supraventricular tachycardia) 2010   11-25-2008  s/p  EP w/ ablation for AV nodal reentry tachycardia by dr Robert Arias   PTSD (post-traumatic stress disorder)    Wears glasses      Past Surgical History:  Procedure Laterality Date   CARDIAC ELECTROPHYSIOLOGY MAPPING AND ABLATION  11-25-2008    dr Robert Arias  [facility]   successful ablation AV nodal reentry tachycardia   CARDIOVASCULAR STRESS TEST  10/30/2008   normal  nuclear study w/ no ischemia/  normal LV function and wall motion , ef 50%   CIRCUMCISION N/A 08/10/2017   Procedure: CIRCUMCISION ADULT;  Surgeon: Robert Hummer, MD;  Location: [facility];  Service: Urology;  Laterality: N/A;   KNEE ARTHROSCOPY Bilateral yrs ago   TOTAL KNEE ARTHROPLASTY Bilateral 2012;  2013   TRANSTHORACIC ECHOCARDIOGRAM  11/23/2008   ef 60%,  grade 1 diastlic dysfunction/  mild LAE/  mild PR and TR    No family history on file.   Social Connections: Not on file     Current Outpatient Medications  Medication Instructions   amLODipine (NORVASC) 5 mg, Daily   aspirin 81 mg, Daily   atorvastatin (LIPITOR) 80 mg, Daily   Cholecalciferol (VITAMIN D-3) 1000 units CAPS Daily   hydrochlorothiazide (HYDRODIURIL) 25 mg, Every morning   losartan (COZAAR) 50 mg, Every morning   mupirocin  ointment (BACTROBAN ) 2 % 1 Application, Topical, 2 times daily   omeprazole (PRILOSEC) 20 mg, Daily   prazosin (MINIPRESS) 1 mg, As needed   senna-docusate (SENOKOT-S) 8.6-50 MG tablet 1 tablet, Oral, 2 times daily, While taking strong pain meds to prevent constipation   sertraline (ZOLOFT) 100 mg, Daily at bedtime   simvastatin (ZOCOR) 40 mg, Every morning   traMADol  (ULTRAM ) 50-100 mg, Oral, Every 6 hours PRN, Post-operatively     Physical Exam:  BP 136/87 (BP Location: Right Arm, Patient Position: Sitting)   Pulse 65   Ht 6' 4 (1.93 m)   Wt 230 lb (104.3 kg)   SpO2 97%   BMI 28.00 kg/m   Salient findings:  CN II-XII intact   Bilateral EAC clear and TM intact with well pneumatized middle ear spaces  Anterior rhinoscopy: Septum midline; bilateral inferior turbinates with no significant hypertrophy.  No obvious nasal ulcerations anteriorly Nasal endoscopy was indicated to better evaluate the nose and paranasal sinuses, given the patient's history and exam findings, and is detailed below.  No lesions of oral cavity/oropharynx  No obviously palpable neck  masses/lymphadenopathy/thyromegaly  No respiratory distress or stridor  Seprately Identifiable Procedures:  Prior to initiating any procedures, risks/benefits/alternatives were explained to the patient and verbal consent obtained.  PROCEDURE (01/30/2024): Bilateral Diagnostic Rigid Nasal Endoscopy Pre-procedure diagnosis: Concern for nasal cavity lesion Post-procedure diagnosis: same Indication: See pre-procedure diagnosis and physical exam above Complications: None apparent EBL: 0 mL Anesthesia: Lidocaine  4% and topical decongestant was topically sprayed in each nasal cavity  Description of Procedure:  Patient was identified. A rigid 30 degree endoscope was utilized to evaluate the sinonasal cavities, mucosa, sinus ostia and turbinates and septum.  No mucopurulence, polyps, or masses noted.   Asked the patient to use a Q-tip to point to the area of concern.  He points to an area on the superior aspect of the septum close to the [facility] junction.  There are no lesions in this area. Photodocumentation was obtained.  CPT CODE -- 68768 - Mod 25   Impression & Plans:  Robert Arias is a 74 y.o. male with     ICD-10-CM   1. Nasal septal ulcer  J34.0     2. Lesion of nasal mucosa  J34.89       Assessment and Plan Assessment & Plan Nasal mucosal irritation Prior nasal septal ulcer Chronic nasal mucosal irritation and minor trauma, likely secondary to mechanical trauma from chronic Q-tip use for intranasal salve application. Endoscopic examination showed healed mucosa without active lesion, mass, neoplasm, or ongoing trauma. No evidence of malignancy or infection. No anticipated impact on upcoming shoulder surgery. - Advised discontinuation of Q-tip use for intranasal salve application to prevent further mucosal trauma. - Recommended application of a pea-sized amount of salve to the anterior nostril using a finger, followed by gentle compression to facilitate medication spread. - Provided  reassurance regarding absence of malignancy or concerning lesions on examination. - Instructed him to contact the office if symptoms recur or worsen for prompt re-evaluation. - Refilled topical nasal medication as requested. - Confirmed nasal condition will not interfere with upcoming shoulder surgery.   See below regarding exact medications prescribed this encounter including dosages and route: Meds ordered this encounter  Medications   mupirocin  ointment (BACTROBAN ) 2 %    Sig: Apply 1 Application topically 2 (two) times daily for 7 days.    Dispense:  14 g    Refill:  3      Thank you for allowing me the opportunity to care for your patient. Please do not hesitate to contact me should you have any other questions.  Sincerely, Robert Parody, MD Otolaryngologist (ENT), [facility] Phone: [phone] Fax: [phone]  MDM:  Level 4 Complexity/Problems addressed: 4- chronic worsening problem Data complexity: 2-  independent review of referral note - Morbidity: 4- medication management   - Prescription Drug prescribed or managed: yes
